# Patient Record
Sex: Female | Born: 1945
Health system: Southern US, Community
[De-identification: ages and names within clinical notes are randomized; demographics above are authoritative.]

## PROBLEM LIST (undated history)

## (undated) DIAGNOSIS — K5792 Diverticulitis of intestine, part unspecified, without perforation or abscess without bleeding: Secondary | ICD-10-CM

## (undated) DIAGNOSIS — N281 Cyst of kidney, acquired: Secondary | ICD-10-CM

## (undated) DIAGNOSIS — C44629 Squamous cell carcinoma of skin of left upper limb, including shoulder: Secondary | ICD-10-CM

## (undated) DIAGNOSIS — F419 Anxiety disorder, unspecified: Secondary | ICD-10-CM

## (undated) DIAGNOSIS — I10 Essential (primary) hypertension: Secondary | ICD-10-CM

## (undated) DIAGNOSIS — M8580 Other specified disorders of bone density and structure, unspecified site: Secondary | ICD-10-CM

## (undated) DIAGNOSIS — A749 Chlamydial infection, unspecified: Secondary | ICD-10-CM

## (undated) DIAGNOSIS — R87619 Unspecified abnormal cytological findings in specimens from cervix uteri: Secondary | ICD-10-CM

## (undated) HISTORY — DX: Other specified disorders of bone density and structure, unspecified site: M85.80

## (undated) HISTORY — DX: Diverticulitis of intestine, part unspecified, without perforation or abscess without bleeding: K57.92

## (undated) HISTORY — DX: Essential (primary) hypertension: I10

## (undated) HISTORY — DX: Chlamydial infection, unspecified: A74.9

## (undated) HISTORY — PX: GYNECOLOGIC CRYOSURGERY: SHX857

## (undated) HISTORY — PX: WISDOM TOOTH EXTRACTION: SHX21

## (undated) HISTORY — DX: Squamous cell carcinoma of skin of left upper limb, including shoulder: C44.629

## (undated) HISTORY — PX: EYE SURGERY: SHX253

## (undated) HISTORY — DX: Anxiety disorder, unspecified: F41.9

## (undated) HISTORY — DX: Unspecified abnormal cytological findings in specimens from cervix uteri: R87.619

## (undated) HISTORY — DX: Cyst of kidney, acquired: N28.1

## (undated) HISTORY — PX: COLONOSCOPY: SHX174

---

## 1999-03-27 ENCOUNTER — Other Ambulatory Visit: Admission: RE | Admit: 1999-03-27 | Discharge: 1999-03-27 | Payer: Self-pay | Admitting: Obstetrics and Gynecology

## 1999-12-08 ENCOUNTER — Encounter: Admission: RE | Admit: 1999-12-08 | Discharge: 1999-12-08 | Payer: Self-pay | Admitting: Obstetrics and Gynecology

## 1999-12-08 ENCOUNTER — Encounter: Payer: Self-pay | Admitting: Obstetrics and Gynecology

## 2000-04-12 ENCOUNTER — Other Ambulatory Visit: Admission: RE | Admit: 2000-04-12 | Discharge: 2000-04-12 | Payer: Self-pay | Admitting: Obstetrics and Gynecology

## 2001-04-26 ENCOUNTER — Encounter: Admission: RE | Admit: 2001-04-26 | Discharge: 2001-04-26 | Payer: Self-pay | Admitting: Obstetrics and Gynecology

## 2001-04-26 ENCOUNTER — Encounter: Payer: Self-pay | Admitting: Obstetrics and Gynecology

## 2001-05-19 ENCOUNTER — Other Ambulatory Visit: Admission: RE | Admit: 2001-05-19 | Discharge: 2001-05-19 | Payer: Self-pay | Admitting: Obstetrics and Gynecology

## 2002-04-21 ENCOUNTER — Ambulatory Visit (HOSPITAL_BASED_OUTPATIENT_CLINIC_OR_DEPARTMENT_OTHER): Admission: RE | Admit: 2002-04-21 | Discharge: 2002-04-21 | Payer: Self-pay | Admitting: Plastic Surgery

## 2002-05-02 ENCOUNTER — Encounter: Admission: RE | Admit: 2002-05-02 | Discharge: 2002-05-02 | Payer: Self-pay | Admitting: Obstetrics and Gynecology

## 2002-05-02 ENCOUNTER — Encounter: Payer: Self-pay | Admitting: Obstetrics and Gynecology

## 2002-06-06 ENCOUNTER — Other Ambulatory Visit: Admission: RE | Admit: 2002-06-06 | Discharge: 2002-06-06 | Payer: Self-pay | Admitting: Obstetrics and Gynecology

## 2002-06-21 ENCOUNTER — Encounter: Payer: Self-pay | Admitting: Obstetrics and Gynecology

## 2002-06-21 ENCOUNTER — Encounter: Admission: RE | Admit: 2002-06-21 | Discharge: 2002-06-21 | Payer: Self-pay | Admitting: Obstetrics and Gynecology

## 2002-12-02 ENCOUNTER — Encounter: Payer: Self-pay | Admitting: Emergency Medicine

## 2002-12-02 ENCOUNTER — Emergency Department (HOSPITAL_COMMUNITY): Admission: EM | Admit: 2002-12-02 | Discharge: 2002-12-02 | Payer: Self-pay | Admitting: Emergency Medicine

## 2003-05-07 ENCOUNTER — Encounter: Payer: Self-pay | Admitting: Obstetrics and Gynecology

## 2003-05-07 ENCOUNTER — Encounter: Admission: RE | Admit: 2003-05-07 | Discharge: 2003-05-07 | Payer: Self-pay | Admitting: Obstetrics and Gynecology

## 2003-06-11 ENCOUNTER — Other Ambulatory Visit: Admission: RE | Admit: 2003-06-11 | Discharge: 2003-06-11 | Payer: Self-pay | Admitting: Obstetrics and Gynecology

## 2004-05-07 ENCOUNTER — Encounter: Admission: RE | Admit: 2004-05-07 | Discharge: 2004-05-07 | Payer: Self-pay | Admitting: Obstetrics and Gynecology

## 2005-01-28 ENCOUNTER — Ambulatory Visit (HOSPITAL_COMMUNITY): Admission: RE | Admit: 2005-01-28 | Discharge: 2005-01-28 | Payer: Self-pay | Admitting: Orthopedic Surgery

## 2005-05-20 ENCOUNTER — Encounter: Admission: RE | Admit: 2005-05-20 | Discharge: 2005-05-20 | Payer: Self-pay | Admitting: Obstetrics and Gynecology

## 2006-07-07 ENCOUNTER — Encounter: Admission: RE | Admit: 2006-07-07 | Discharge: 2006-07-07 | Payer: Self-pay | Admitting: Obstetrics and Gynecology

## 2007-07-19 ENCOUNTER — Encounter: Admission: RE | Admit: 2007-07-19 | Discharge: 2007-07-19 | Payer: Self-pay | Admitting: Obstetrics and Gynecology

## 2008-07-25 ENCOUNTER — Encounter: Admission: RE | Admit: 2008-07-25 | Discharge: 2008-07-25 | Payer: Self-pay | Admitting: Obstetrics and Gynecology

## 2009-09-09 ENCOUNTER — Encounter: Admission: RE | Admit: 2009-09-09 | Discharge: 2009-09-09 | Payer: Self-pay | Admitting: Obstetrics and Gynecology

## 2010-10-16 ENCOUNTER — Encounter
Admission: RE | Admit: 2010-10-16 | Discharge: 2010-10-16 | Payer: Self-pay | Source: Home / Self Care | Attending: Obstetrics and Gynecology | Admitting: Obstetrics and Gynecology

## 2011-02-27 NOTE — Op Note (Signed)
[place]  Patient:    Tammy Leonard, Tammy Leonard Visit Number: [id number] MRN: [id number]          Service Type: DSU Location: [facility] Attending Physician:  Loura Halt Ii Dictated by:   Alfredia Ferguson, M.D. Proc. Date: 04/21/02 Admit Date:  04/21/2002 Discharge Date: 04/21/2002   CC:         Hope M. Danella Deis, M.D.   Operative Report  PREOPERATIVE DIAGNOSIS: 1. Biopsy proven 8 mm squamous cell carcinoma, left hand, dorsal first web    space. 2. A 3 mm sebaceous hyperplasia, midforehead.  POSTOPERATIVE DIAGNOSIS: 1. Biopsy proven 8 mm squamous cell carcinoma, left hand, dorsal first web    space. 2. A 3 mm sebaceous hyperplasia, midforehead.  OPERATION PERFORMED: 1. Elliptical excision of squamous cell carcinoma with approximately 3 mm    margins, first webspace left hand, dorsal side. 2. Cauterization and curetting of sebaceous hyperplasia of midforehead.  SURGEON:  Alfredia Ferguson, M.D.  ANESTHESIA:  2% Xylocaine 1:100,000 epinephrine.  INDICATIONS FOR PROCEDURE:  The patient is a 65 year old woman with a biopsy proven squamous cell carcinoma located on the dorsal first webspace of her left hand.  The patient wishes to have the area widely excised to ensure clear margins.  She also had a prominent sebaceous hyperplasia in the midforehead which I will hyfrecate and curet.  She understands there is a very incidence of recurrence of sebaceous hyperplasia but wishes to try to see if this will help.  She also understands the risk of positive margins on her squamous cell carcinoma and the need for secondary surgery.  She is trading what she has for a permanent and potentially unsightly scar.  In spite of that, the patient wishes to proceed.  DESCRIPTION OF PROCEDURE:  Skin markers were placed in elliptical fashion around the biopsy site of the squamous cell carcinoma left hand dorsal side. Local anesthesia was infiltrated.  The area was  prepped and draped in sterile fashion.  Local anesthesia was also infiltrated at the site of the sebaceous hyperplasia.  The area was prepped with Betadine.  With the Bovie on a very low setting, the surface of the sebaceous hyperplasia was cauterized and then was curetted.  The base was then recauterized.  The patient tolerated the procedure well.  Attention was then directed to the hand.  An elliptical excision of the lesion was carried out down to the level of the subcutaneous space.  The specimen was passed off for pathology.  Hemostasis was accomplished using area.  The wound was closed using 5-0 nylon running suture.  A light dressing was applied.  The patient tolerated the procedure well with almost no bleeding.  She was discharged to home in satisfactory condition. Dictated by:   Alfredia Ferguson, M.D. Attending Physician:  Loura Halt Ii DD:  04/21/02 TD:  04/24/02 Job: [id number]

## 2011-11-20 ENCOUNTER — Other Ambulatory Visit: Payer: Self-pay | Admitting: Obstetrics & Gynecology

## 2011-11-20 DIAGNOSIS — Z1231 Encounter for screening mammogram for malignant neoplasm of breast: Secondary | ICD-10-CM

## 2011-12-09 ENCOUNTER — Ambulatory Visit: Payer: Self-pay

## 2011-12-16 ENCOUNTER — Ambulatory Visit: Payer: Self-pay

## 2011-12-16 ENCOUNTER — Ambulatory Visit
Admission: RE | Admit: 2011-12-16 | Discharge: 2011-12-16 | Disposition: A | Discharge status: Home / Self Care | Payer: BC Managed Care – PPO | Source: Ambulatory Visit | Attending: Obstetrics & Gynecology | Admitting: Obstetrics & Gynecology

## 2011-12-16 DIAGNOSIS — Z1231 Encounter for screening mammogram for malignant neoplasm of breast: Secondary | ICD-10-CM

## 2012-05-03 ENCOUNTER — Other Ambulatory Visit: Payer: Self-pay | Admitting: Internal Medicine

## 2012-05-03 NOTE — Telephone Encounter (Signed)
Needs chart.

## 2012-05-20 ENCOUNTER — Encounter: Payer: Self-pay | Admitting: Internal Medicine

## 2012-05-20 ENCOUNTER — Ambulatory Visit (INDEPENDENT_AMBULATORY_CARE_PROVIDER_SITE_OTHER): Payer: BC Managed Care – PPO | Admitting: Internal Medicine

## 2012-05-20 VITALS — BP 185/77 | HR 63 | Temp 98.7°F | Resp 17 | Ht 61.0 in | Wt 125.0 lb

## 2012-05-20 DIAGNOSIS — F419 Anxiety disorder, unspecified: Secondary | ICD-10-CM

## 2012-05-20 DIAGNOSIS — F411 Generalized anxiety disorder: Secondary | ICD-10-CM

## 2012-05-20 MED ORDER — ALPRAZOLAM 0.5 MG PO TABS: 0.5000 mg | ORAL_TABLET | Freq: Every evening | ORAL | Status: DC | PRN

## 2012-05-20 NOTE — Patient Instructions (Signed)

## 2012-05-20 NOTE — Progress Notes (Signed)
  Subjective:    Patient ID: Tammy Leonard, female    DOB: November 23, 1945, 66 y.o.   MRN: [id number]  HPI Anxiety, does well, uses alprazolam sparingly.   Review of Systems Has all complete check ups with ob/gyn    Objective:   Physical Exam normal       Assessment & Plan:  Alprazolam rf

## 2012-06-29 ENCOUNTER — Ambulatory Visit (INDEPENDENT_AMBULATORY_CARE_PROVIDER_SITE_OTHER): Payer: BC Managed Care – PPO | Admitting: Family Medicine

## 2012-06-29 DIAGNOSIS — Z23 Encounter for immunization: Secondary | ICD-10-CM

## 2013-03-17 ENCOUNTER — Other Ambulatory Visit: Payer: Self-pay

## 2013-03-17 DIAGNOSIS — Z1231 Encounter for screening mammogram for malignant neoplasm of breast: Secondary | ICD-10-CM

## 2013-04-21 ENCOUNTER — Ambulatory Visit
Admission: RE | Admit: 2013-04-21 | Discharge: 2013-04-21 | Disposition: A | Discharge status: Home / Self Care | Payer: BLUE CROSS/BLUE SHIELD | Source: Ambulatory Visit

## 2013-04-21 DIAGNOSIS — Z1231 Encounter for screening mammogram for malignant neoplasm of breast: Secondary | ICD-10-CM

## 2013-06-05 DIAGNOSIS — L570 Actinic keratosis: Secondary | ICD-10-CM | POA: Diagnosis not present

## 2013-06-05 DIAGNOSIS — L719 Rosacea, unspecified: Secondary | ICD-10-CM | POA: Diagnosis not present

## 2013-06-19 ENCOUNTER — Other Ambulatory Visit: Payer: Self-pay | Admitting: Internal Medicine

## 2013-06-19 DIAGNOSIS — F419 Anxiety disorder, unspecified: Secondary | ICD-10-CM

## 2013-06-19 MED ORDER — ALPRAZOLAM 0.5 MG PO TABS: 0.5000 mg | ORAL_TABLET | Freq: Every evening | ORAL | Status: DC | PRN

## 2013-08-01 ENCOUNTER — Ambulatory Visit: Payer: Medicare Other | Admitting: Sports Medicine

## 2013-08-22 ENCOUNTER — Ambulatory Visit: Payer: Medicare Other | Admitting: Sports Medicine

## 2013-08-22 ENCOUNTER — Ambulatory Visit (INDEPENDENT_AMBULATORY_CARE_PROVIDER_SITE_OTHER): Payer: Medicare Other | Admitting: Sports Medicine

## 2013-08-22 ENCOUNTER — Encounter: Payer: Self-pay | Admitting: Sports Medicine

## 2013-08-22 VITALS — BP 188/89 | HR 75 | Ht 61.0 in | Wt 120.0 lb

## 2013-08-22 DIAGNOSIS — IMO0002 Reserved for concepts with insufficient information to code with codable children: Secondary | ICD-10-CM

## 2013-08-22 DIAGNOSIS — R030 Elevated blood-pressure reading, without diagnosis of hypertension: Secondary | ICD-10-CM | POA: Diagnosis not present

## 2013-08-22 DIAGNOSIS — M79609 Pain in unspecified limb: Secondary | ICD-10-CM | POA: Diagnosis not present

## 2013-08-22 DIAGNOSIS — S76311A Strain of muscle, fascia and tendon of the posterior muscle group at thigh level, right thigh, initial encounter: Secondary | ICD-10-CM

## 2013-08-22 NOTE — Progress Notes (Signed)
  Subjective:    Patient ID: Tammy Leonard, female    DOB: 02/06/1946, 67 y.o.   MRN: [id number]  HPI Patient is a 67 yo female new patient who presents for evaluation of right hamstring pain. States about 8 weeks ago she was playing tennis. She was at the net when she had to turn and sprint to the baseline. While sprinting she noted a sudden pain and pop in the middle of her hamstring. She discontinued playing at that time. She denies swelling or bruising in the area. She held off on exercise over the next week and used ice and epsom salt baths on this area. She notes over the next 2-3 weeks she eased herself back in to playing tennis with no long strides as she had pain in her hamstring with these. She also notes using KT tape and found this helpful. She has been taking aleve. Notes has been feeling improved and feels as though she is back to about 85% normal.    Review of Systems see HPI     Objective:   Physical Exam Well nourished, well developed  Right LE: no apparent swelling or erythema over hamstring, she has full range of motion at the knee, she has mild tenderness to palpation over the mid portion of her hamstring, she is non-tender at the origin and insertion sites of the hamstring, 4/5 strength on testing all 3 heads of the hamstring, 4/5 strength with isolation of the biceps femoris, she is neurovascularly intact, H test with ROM past 90 degrees  Left LE: no apparent swelling or erythema over hamstring, she has full range of motion at the knee, she has no tenderness to palpation over her hamstring, she is non-tender at the origin and insertion sites of the hamstring, 5/5 strength on testing all 3 heads of the hamstring, 5/5 strength with isolation of the biceps femoris, she is neurovascularly intact, H test with ROM past 90 degrees  On running her right foot is externally rotated. Post-shoe lift placement this external rotation is improved.  Ultrasound of right hamstring performed  in the longitudinal and transverse views revealing hyperechoic area underlying location of pain indicating fibrosis in the area of injury, no evidence of effusion.      Assessment & Plan:  Grade II hamstring strain Elevated blood pressure  Please see individual problems for plan.

## 2013-08-22 NOTE — Assessment & Plan Note (Signed)
Patient with elevation in blood pressure today in clinic. Notes no prior history of hypertension. Notes has previously checked it at home several months ago and was typically in the 140's/80's. Discussed with her that she should check her blood pressure using her husbands blood pressure cuff at home for the next few days and then call back to the [facility]. If continues to be elevated she will need to be started on medication and should be followed by her PCP.

## 2013-08-22 NOTE — Patient Instructions (Addendum)
Nice to meet you. Please wear the sleeve over your hamstring for the next 3-6 months. Please do the exercises listed below. Do these 5-8 reps, 2-3 sets at a time. Extender: laying on your back, flex at your hip with your knee bent, then extend your lower leg to straighten out your leg. Do this slowly to stretch your hamstring. Diver stretch: stand on one leg, bend from the waist to touch towards the ground. With ankle weight, hamstring curls: stand and curl your heal up in the air to flex your hamstring. Leg swing: moving in the posterior direction curls your hamstring like above, though also move from your hip as well. Modified lunge: lunges that are like volley motion in tennis

## 2013-08-22 NOTE — Assessment & Plan Note (Addendum)
Patient with grade II strain of right hamstring that has been improving. US revealed area of fibrosis in the area of her pain indicating that her lesion is healing. Discussed that she has likely suffered a tear in the region of her injury and that given her age she is at higher likelihood for a future tear of her hamstring. Discussed and demonstrated exercises and stretches for the patient to do to help in the recovery from this injury. Will provide with heal lifts to limit the stretch of her hamstrings while continuing to play tennis. She was also provided with a sleeve for her hamstring. She is to where this during physical activity. Discussed that she is to follow-up as needed if her pain does not continue to improve.

## 2013-09-05 ENCOUNTER — Encounter: Payer: Self-pay | Admitting: Family Medicine

## 2013-09-05 ENCOUNTER — Ambulatory Visit (INDEPENDENT_AMBULATORY_CARE_PROVIDER_SITE_OTHER): Payer: Medicare Other | Admitting: Family Medicine

## 2013-09-05 VITALS — BP 170/80 | HR 70 | Temp 98.6°F | Resp 16 | Ht 61.0 in | Wt 126.0 lb

## 2013-09-05 DIAGNOSIS — I10 Essential (primary) hypertension: Secondary | ICD-10-CM | POA: Diagnosis not present

## 2013-09-05 DIAGNOSIS — Z23 Encounter for immunization: Secondary | ICD-10-CM | POA: Diagnosis not present

## 2013-09-05 DIAGNOSIS — Z8719 Personal history of other diseases of the digestive system: Secondary | ICD-10-CM | POA: Diagnosis not present

## 2013-09-05 DIAGNOSIS — Z0000 Encounter for general adult medical examination without abnormal findings: Secondary | ICD-10-CM

## 2013-09-05 DIAGNOSIS — Z8659 Personal history of other mental and behavioral disorders: Secondary | ICD-10-CM

## 2013-09-05 LAB — COMPREHENSIVE METABOLIC PANEL
AST: 31 U/L (ref 0–37)
Alkaline Phosphatase: 49 U/L (ref 39–117)
BUN: 23 mg/dL (ref 6–23)
CO2: 25 mEq/L (ref 19–32)
Chloride: 103 mEq/L (ref 96–112)
Creat: 0.9 mg/dL (ref 0.50–1.10)
Glucose, Bld: 106 mg/dL — ABNORMAL HIGH (ref 70–99)
Potassium: 4.4 mEq/L (ref 3.5–5.3)
Sodium: 139 mEq/L (ref 135–145)
Total Bilirubin: 0.5 mg/dL (ref 0.3–1.2)
Total Protein: 7.2 g/dL (ref 6.0–8.3)

## 2013-09-05 LAB — POCT CBC
Granulocyte percent: 67.3 %G (ref 37–80)
Hemoglobin: 14.4 g/dL (ref 12.2–16.2)
Lymph, poc: 1.5 (ref 0.6–3.4)
MCHC: 31.2 g/dL — AB (ref 31.8–35.4)
MPV: 7.8 fL (ref 0–99.8)
Platelet Count, POC: 253 10*3/uL (ref 142–424)
WBC: 5.6 10*3/uL (ref 4.6–10.2)

## 2013-09-05 MED ORDER — LISINOPRIL 10 MG PO TABS: 10.0000 mg | ORAL_TABLET | Freq: Every day | ORAL | Status: DC

## 2013-09-05 NOTE — Patient Instructions (Signed)
Great to see you today- happy thanksgiving!   Start on the lisinopril for your BP once a day- please send me a few BP readings in a couple of weeks.  I will be in touch with the rest of your labs; you can see these online on Mychart if you would like.

## 2013-09-05 NOTE — Progress Notes (Signed)
[facility] [address] [place] [address] [phone]  Date:  09/05/2013   Name:  Tammy Leonard   DOB:  11/18/1945   MRN:  [id number]  PCP:  Tally Due, MD    Chief Complaint: Annual Exam   History of Present Illness:  Tammy Leonard is a 67 y.o. very pleasant female patient who presents with the following:  Here as a new pt today for a CPE- no pap Non- smoker, does drink about 10 drinks per week.   She is married, does exercise.  She likes to play tennis and golf, and practices and pilates.   She is also a pt at [facility] [employer].  She is seen there regularly.   Just about a year ago she started checking her BP.  She has had it checked at Dr. Gordy Savers office and her BP was 188/89.   She has been checking her BP at home, and has found that her systolic pressures tend to run in the 140s- 160s.  She is starting to get worried about this and does not know if she should start on some medication She does not have any sx such as HA or CP . When she exercises she does not have CP  She is fasting today.   She takes xanax as needed for anxiety; she does not take it very often.she notes that a bottle will often last a whole year.   She does have a history of diverticulities.  Dr. Kinnie Scales is her GI doctor.  He follows her regularly and her colonoscopy is UTD  She had a bone density test a few years ago that looked ok.  She will see her GYN in the spring, will discuss her Dexa then   She thinks that her tetanus shot is UTD but she will send [place] a copy of the records that she has at home.   Patient Active Problem List   Diagnosis Date Noted  . Right hamstring muscle strain 08/22/2013  . Elevated blood pressure 08/22/2013    Past Medical History  Diagnosis Date  . Anxiety   . Diverticulitis     No past surgical history on file.  History  Substance Use Topics  . Smoking status: Never Smoker   . Smokeless tobacco: Never Used  . Alcohol Use: Not on  file    Family History  Problem Relation Age of Onset  . Heart disease Mother   . Heart disease Father     Allergies  Allergen Reactions  . Compazine [Prochlorperazine Edisylate] Anxiety    Medication list has been reviewed and updated.  Current Outpatient Prescriptions on File Prior to Visit  Medication Sig Dispense Refill  . ALPRAZolam (XANAX) 0.5 MG tablet Take 1 tablet (0.5 mg total) by mouth at bedtime as needed.  60 tablet  3  . ampicillin (PRINCIPEN) 500 MG capsule Take 500 mg by mouth 1 day or 1 dose.        No current facility-administered medications on file prior to visit.    Review of Systems:  As per HPI- otherwise negative.   Physical Examination: Filed Vitals:   09/05/13 0812  BP: 176/82  Pulse: 70  Temp: 98.6 F (37 C)  Resp: 16   Filed Vitals:   09/05/13 0812  Height: 5\' 1"  (1.549 m)  Weight: 126 lb (57.153 kg)   Body mass index is 23.82 kg/(m^2). Ideal Body Weight: Weight in (lb) to have BMI = 25: 132  GEN: WDWN, NAD, Non-toxic, A & O x 3, looks well HEENT: Atraumatic, Normocephalic. Neck supple. No masses, No LAD.  Bilateral TM wnl, oropharynx normal.  PEERL,EOMI.   Ears and Nose: No external deformity. CV: RRR, No M/G/R. No JVD. No thrill. No extra heart sounds. PULM: CTA B, no wheezes, crackles, rhonchi. No retractions. No resp. distress. No accessory muscle use. ABD: S, NT, ND, +BS. No rebound. No HSM. EXTR: No c/c/e NEURO Normal gait.  PSYCH: Normally interactive. Conversant. Not depressed or anxious appearing.  Calm demeanor.   EKG:  Appears normal to my read.  NSR without ST elevation or depression.  Machine read is abnormal  Assessment and Plan: Physical exam - Plan: Pneumococcal polysaccharide vaccine 23-valent greater than or equal to 2yo subcutaneous/IM  High blood pressure - Plan: TSH, Lipid panel, EKG 12-Lead, lisinopril (PRINIVIL,ZESTRIL) 10 MG tablet  History of diverticulitis of colon - Plan: POCT CBC, Comprehensive  metabolic panel  History of anxiety  CPE today- caught up on her pneumovax.  She will send her records regarding her other immunizations Start lisinopril 10mg  for HTN Will plan further follow- up pending labs. Discussed abnormal EKG read.  She exercises regularly and does not have any history of CP.  Discussed referral for stress test but at this time she would like to defer.   See patient instructions for more details.     Results for orders placed in visit on 09/05/13  COMPREHENSIVE METABOLIC PANEL      Result Value Range   Sodium 139  135 - 145 mEq/L   Potassium 4.4  3.5 - 5.3 mEq/L   Chloride 103  96 - 112 mEq/L   CO2 25  19 - 32 mEq/L   Glucose, Bld 106 (*) 70 - 99 mg/dL   BUN 23  6 - 23 mg/dL   Creat 1.47  8.29 - 5.62 mg/dL   Total Bilirubin 0.5  0.3 - 1.2 mg/dL   Alkaline Phosphatase 49  39 - 117 U/L   AST 31  0 - 37 U/L   ALT 23  0 - 35 U/L   Total Protein 7.2  6.0 - 8.3 g/dL   Albumin 4.8  3.5 - 5.2 g/dL   Calcium 13.0  8.4 - 86.5 mg/dL  TSH      Result Value Range   TSH 0.766  0.350 - 4.500 uIU/mL  LIPID PANEL      Result Value Range   Cholesterol 220 (*) 0 - 200 mg/dL   Triglycerides 38  <784 mg/dL   HDL 696  >29 mg/dL   Total CHOL/HDL Ratio 1.8     VLDL 8  0 - 40 mg/dL   LDL Cholesterol 91  0 - 99 mg/dL  POCT CBC      Result Value Range   WBC 5.6  4.6 - 10.2 K/uL   Lymph, poc 1.5  0.6 - 3.4   POC LYMPH PERCENT 27.1  10 - 50 %L   MID (cbc) 0.3  0 - 0.9   POC MID % 5.6  0 - 12 %M   POC Granulocyte 3.8  2 - 6.9   Granulocyte percent 67.3  37 - 80 %G   RBC 4.70  4.04 - 5.48 M/uL   Hemoglobin 14.4  12.2 - 16.2 g/dL   HCT, POC 52.8  41.3 - 47.9 %   MCV 98.2 (*) 80 - 97 fL   MCH, POC 30.6  27 - 31.2 pg   MCHC 31.2 (*)  31.8 - 35.4 g/dL   RDW, POC 16.1     Platelet Count, POC 253  142 - 424 K/uL   MPV 7.8  0 - 99.8 fL   Declines flu shot today Signed Abbe Amsterdam, MD

## 2013-09-11 DIAGNOSIS — L821 Other seborrheic keratosis: Secondary | ICD-10-CM | POA: Diagnosis not present

## 2013-09-11 DIAGNOSIS — D485 Neoplasm of uncertain behavior of skin: Secondary | ICD-10-CM | POA: Diagnosis not present

## 2013-09-11 DIAGNOSIS — Z85828 Personal history of other malignant neoplasm of skin: Secondary | ICD-10-CM | POA: Diagnosis not present

## 2013-09-11 DIAGNOSIS — L719 Rosacea, unspecified: Secondary | ICD-10-CM | POA: Diagnosis not present

## 2013-09-11 DIAGNOSIS — D239 Other benign neoplasm of skin, unspecified: Secondary | ICD-10-CM | POA: Diagnosis not present

## 2013-09-11 DIAGNOSIS — D2330 Other benign neoplasm of skin of unspecified part of face: Secondary | ICD-10-CM | POA: Diagnosis not present

## 2013-09-11 DIAGNOSIS — L570 Actinic keratosis: Secondary | ICD-10-CM | POA: Diagnosis not present

## 2013-09-21 ENCOUNTER — Encounter: Payer: Self-pay | Admitting: Family Medicine

## 2013-10-13 ENCOUNTER — Encounter: Payer: Self-pay | Admitting: Family Medicine

## 2013-10-13 DIAGNOSIS — G4700 Insomnia, unspecified: Secondary | ICD-10-CM

## 2013-10-15 MED ORDER — ZOLPIDEM TARTRATE 10 MG PO TABS: 5.0000 mg | ORAL_TABLET | Freq: Every evening | ORAL | Status: DC | PRN

## 2013-11-30 ENCOUNTER — Encounter: Payer: Self-pay | Admitting: Obstetrics & Gynecology

## 2013-11-30 ENCOUNTER — Ambulatory Visit (INDEPENDENT_AMBULATORY_CARE_PROVIDER_SITE_OTHER): Payer: Medicare Other | Admitting: Obstetrics & Gynecology

## 2013-11-30 VITALS — BP 154/68 | HR 64 | Resp 16 | Ht 61.0 in | Wt 128.2 lb

## 2013-11-30 DIAGNOSIS — Z124 Encounter for screening for malignant neoplasm of cervix: Secondary | ICD-10-CM | POA: Diagnosis not present

## 2013-11-30 DIAGNOSIS — E2839 Other primary ovarian failure: Secondary | ICD-10-CM

## 2013-11-30 DIAGNOSIS — Z01419 Encounter for gynecological examination (general) (routine) without abnormal findings: Secondary | ICD-10-CM

## 2013-11-30 NOTE — Progress Notes (Signed)
68 y.o. G2P2 MarriedCaucasianF here for annual exam.  No vaginal bleeding.  Doing well.  On Lisinopril.  Having a little cough so she is doing a "trial" to see if the cough is medication related.    No LMP recorded. Patient is postmenopausal.          Sexually active: no  The current method of family planning is vasectomy.    Exercising: yes  tennis, golf, yoga, and pilates Smoker:  no  Health Maintenance: Pap:  08/29/12 WNL/negative HR HPV History of abnormal Pap:  yes MMG:  04/21/13 3D normal Colonoscopy:  2011 repeat in 5-7 years BMD:   2008/09 normal TDaP:  2006 Screening Labs: PCP, Hb today: PCP, Urine today: PCP   reports that she has never smoked. She has never used smokeless tobacco. She reports that she drinks about 3.5 ounces of alcohol per week. She reports that she does not use illicit drugs.  Past Medical History  Diagnosis Date  . Anxiety   . Diverticulitis   . Renal cyst     stable CT 5/11 [place] 10/12  . Chlamydia     h/o  . Abnormal Pap smear of cervix     Past Surgical History  Procedure Laterality Date  . Gynecologic cryosurgery      Current Outpatient Prescriptions  Medication Sig Dispense Refill  . ALPRAZolam (XANAX) 0.5 MG tablet Take 1 tablet (0.5 mg total) by mouth at bedtime as needed.  60 tablet  3  . ampicillin (PRINCIPEN) 500 MG capsule Take 500 mg by mouth daily. 5 x week      . aspirin 81 MG tablet Take 81 mg by mouth daily.      . B Complex Vitamins (VITAMIN B COMPLEX PO) Take by mouth daily.      Marland Kitchen CALCIUM PO Take by mouth. + magnesium and vitamin d daily      . Glucosamine HCl (GLUCOSAMINE PO) Take by mouth daily.      Marland Kitchen lisinopril (PRINIVIL,ZESTRIL) 10 MG tablet Take 1 tablet (10 mg total) by mouth daily.  30 tablet  3  . Multiple Vitamins-Minerals (MULTIVITAMIN PO) Take by mouth daily.       No current facility-administered medications for this visit.    Family History  Problem Relation Age of Onset  . Heart disease Mother   . Heart  disease Father   . Breast cancer Mother 58    and stomach cancer  . Prostate cancer Father   . Mitral valve prolapse Mother   . Depression Daughter     ROS:  Pertinent items are noted in HPI.  Otherwise, a comprehensive ROS was negative.  Exam:   BP 154/68  Pulse 64  Resp 16  Ht 5\' 1"  (1.549 m)  Wt 128 lb 3.2 oz (58.151 kg)  BMI 24.24 kg/m2  Weight change: -2lb   Height: 5\' 1"  (154.9 cm)  Ht Readings from Last 3 Encounters:  11/30/13 5\' 1"  (1.549 m)  09/05/13 5\' 1"  (1.549 m)  08/22/13 5\' 1"  (1.549 m)    General appearance: alert, cooperative and appears stated age Head: Normocephalic, without obvious abnormality, atraumatic Neck: no adenopathy, supple, symmetrical, trachea midline and thyroid normal to inspection and palpation Lungs: clear to auscultation bilaterally Breasts: normal appearance, no masses or tenderness Heart: regular rate and rhythm Abdomen: soft, non-tender; bowel sounds normal; no masses,  no organomegaly Extremities: extremities normal, atraumatic, no cyanosis or edema Skin: Skin color, texture, turgor normal. No rashes or lesions Lymph nodes:  Cervical, supraclavicular, and axillary nodes normal. No abnormal inguinal nodes palpated Neurologic: Grossly normal   Pelvic: External genitalia:  no lesions              Urethra:  normal appearing urethra with no masses, tenderness or lesions              Bartholins and Skenes: normal                 Vagina: normal appearing vagina with normal color and discharge, no lesions              Cervix: no lesions              Pap taken: no Bimanual Exam:  Uterus:  normal size, contour, position, consistency, mobility, non-tender              Adnexa: normal adnexa and no mass, fullness, tenderness               Rectovaginal: Confirms               Anus:  normal sphincter tone, no lesions  A:  Well Woman with normal exam H/o sever dysplasia, 30 years H/o DDD in lumbar spine  P:   Mammogram yearly.   pap smear not  indicated. Order for BMD given. return annually or prn  An After Visit Summary was printed and given to the patient.

## 2014-01-16 ENCOUNTER — Other Ambulatory Visit: Payer: Self-pay | Admitting: Family Medicine

## 2014-02-18 ENCOUNTER — Other Ambulatory Visit: Payer: Self-pay | Admitting: Family Medicine

## 2014-03-19 ENCOUNTER — Other Ambulatory Visit: Payer: Self-pay | Admitting: Family Medicine

## 2014-04-23 ENCOUNTER — Ambulatory Visit (INDEPENDENT_AMBULATORY_CARE_PROVIDER_SITE_OTHER): Payer: Medicare Other | Admitting: Family Medicine

## 2014-04-23 ENCOUNTER — Encounter: Payer: Self-pay | Admitting: Family Medicine

## 2014-04-23 VITALS — BP 140/62 | HR 64 | Temp 98.6°F | Resp 16 | Ht 60.75 in | Wt 125.0 lb

## 2014-04-23 DIAGNOSIS — I10 Essential (primary) hypertension: Secondary | ICD-10-CM

## 2014-04-23 LAB — BASIC METABOLIC PANEL
BUN: 21 mg/dL (ref 6–23)
CALCIUM: 9.9 mg/dL (ref 8.4–10.5)
CO2: 25 mEq/L (ref 19–32)
Chloride: 104 mEq/L (ref 96–112)
Creat: 0.76 mg/dL (ref 0.50–1.10)
GLUCOSE: 131 mg/dL — AB (ref 70–99)
Potassium: 4.2 mEq/L (ref 3.5–5.3)
SODIUM: 138 meq/L (ref 135–145)

## 2014-04-23 MED ORDER — LISINOPRIL 10 MG PO TABS: 10.0000 mg | ORAL_TABLET | Freq: Every day | ORAL | Status: DC

## 2014-04-23 NOTE — Progress Notes (Signed)
[facility] [address], [place] [address] [phone]  Date:  04/23/2014   Name:  Tammy Leonard   DOB:  07/16/1946   MRN:  [id number]  PCP:  Lamar Blinks, MD    Chief Complaint: Medication Refill   History of Present Illness:  Tammy Leonard is a 68 y.o. very pleasant female patient who presents with the following:  She is here today for a medication refill.  We started lisinopril for her BP at her last physical in November 2014. She is checking her BP at home- she may run 130-144/ 70s.  She feels that she is tolerating the medication ok.  She did have a little cough, but this resolved.  Overall she is doing very well  She had a tdap 2006 prior to a trip to [place]  Patient Active Problem List   Diagnosis Date Noted  . Right hamstring muscle strain 08/22/2013  . Elevated blood pressure 08/22/2013    Past Medical History  Diagnosis Date  . Anxiety   . Diverticulitis   . Renal cyst     stable CT 5/11 [place] 10/12  . Chlamydia     h/o  . Abnormal Pap smear of cervix   . Hypertension     Past Surgical History  Procedure Laterality Date  . Gynecologic cryosurgery      History  Substance Use Topics  . Smoking status: Never Smoker   . Smokeless tobacco: Never Used  . Alcohol Use: 3.5 oz/week    7 drink(s) per week     Comment: glass of wine daily    Family History  Problem Relation Age of Onset  . Heart disease Mother   . Heart disease Father   . Breast cancer Mother 39    and stomach cancer  . Prostate cancer Father   . Mitral valve prolapse Mother   . Depression Daughter     Allergies  Allergen Reactions  . Compazine [Prochlorperazine Edisylate] Anxiety    Medication list has been reviewed and updated.  Current Outpatient Prescriptions on File Prior to Visit  Medication Sig Dispense Refill  . ALPRAZolam (XANAX) 0.5 MG tablet Take 1 tablet (0.5 mg total) by mouth at bedtime as needed.  60 tablet  3  . ampicillin (PRINCIPEN)  500 MG capsule Take 500 mg by mouth daily. 5 x week      . aspirin 81 MG tablet Take 81 mg by mouth daily.      . B Complex Vitamins (VITAMIN B COMPLEX PO) Take by mouth daily.      Marland Kitchen CALCIUM PO Take by mouth. + magnesium and vitamin d daily      . Glucosamine HCl (GLUCOSAMINE PO) Take by mouth daily.      Marland Kitchen lisinopril (PRINIVIL,ZESTRIL) 10 MG tablet Take 1 tablet (10 mg total) by mouth daily. PATIENT NEEDS OFFICE VISIT FOR ADDITIONAL REFILLS - 2nd NOTICE  15 tablet  0  . Multiple Vitamins-Minerals (MULTIVITAMIN PO) Take by mouth daily.       No current facility-administered medications on file prior to visit.    Review of Systems:  As per HPI- otherwise negative.   Physical Examination: Filed Vitals:   04/23/14 1107  BP: 140/62  Pulse: 64  Temp: 98.6 F (37 C)  Resp: 16   Filed Vitals:   04/23/14 1107  Height: 5' 0.75" (1.543 m)  Weight: 125 lb (56.7 kg)   Body mass index is 23.82 kg/(m^2). Ideal Body Weight: Weight  in (lb) to have BMI = 25: 131  GEN: WDWN, NAD, Non-toxic, A & O x 3, looks well, fit build and active appearance HEENT: Atraumatic, Normocephalic. Neck supple. No masses, No LAD. Ears and Nose: No external deformity. CV: RRR, No M/G/R. No JVD. No thrill. No extra heart sounds. PULM: CTA B, no wheezes, crackles, rhonchi. No retractions. No resp. distress. No accessory muscle use. ABD: S, NT, ND. No rebound. No HSM. EXTR: No c/c/e NEURO Normal gait.  PSYCH: Normally interactive. Conversant. Not depressed or anxious appearing.  Calm demeanor.    Assessment and Plan: Essential hypertension - Plan: lisinopril (PRINIVIL,ZESTRIL) 10 MG tablet, Basic metabolic panel  Refilled her lisinopril today- it seems to be working well for her.  Check BMP.    She will come and see me for a CPE this fall/ winter/   Signed Lamar Blinks, MD

## 2014-04-23 NOTE — Patient Instructions (Signed)
Good to see you again today!  I will be in touch with your labs.  Your BP looks much better.  Please come and see me for a physical sometime this fall or winter.

## 2014-05-29 ENCOUNTER — Other Ambulatory Visit: Payer: Self-pay

## 2014-05-29 DIAGNOSIS — Z1231 Encounter for screening mammogram for malignant neoplasm of breast: Secondary | ICD-10-CM

## 2014-06-14 ENCOUNTER — Ambulatory Visit
Admission: RE | Admit: 2014-06-14 | Discharge: 2014-06-14 | Disposition: A | Discharge status: Home / Self Care | Payer: Medicare Other | Source: Ambulatory Visit

## 2014-06-14 ENCOUNTER — Ambulatory Visit
Admission: RE | Admit: 2014-06-14 | Discharge: 2014-06-14 | Disposition: A | Discharge status: Home / Self Care | Payer: Medicare Other | Source: Ambulatory Visit | Attending: Obstetrics & Gynecology | Admitting: Obstetrics & Gynecology

## 2014-06-14 DIAGNOSIS — E2839 Other primary ovarian failure: Secondary | ICD-10-CM

## 2014-06-14 DIAGNOSIS — Z1231 Encounter for screening mammogram for malignant neoplasm of breast: Secondary | ICD-10-CM | POA: Diagnosis not present

## 2014-06-14 DIAGNOSIS — Z780 Asymptomatic menopausal state: Secondary | ICD-10-CM | POA: Diagnosis not present

## 2014-08-04 ENCOUNTER — Ambulatory Visit (INDEPENDENT_AMBULATORY_CARE_PROVIDER_SITE_OTHER): Payer: Medicare Other | Admitting: Emergency Medicine

## 2014-08-04 ENCOUNTER — Ambulatory Visit (INDEPENDENT_AMBULATORY_CARE_PROVIDER_SITE_OTHER): Payer: Medicare Other

## 2014-08-04 ENCOUNTER — Other Ambulatory Visit: Payer: Self-pay | Admitting: Emergency Medicine

## 2014-08-04 VITALS — BP 170/70 | HR 69 | Temp 98.1°F | Resp 16 | Ht 61.0 in | Wt 127.4 lb

## 2014-08-04 DIAGNOSIS — J209 Acute bronchitis, unspecified: Secondary | ICD-10-CM

## 2014-08-04 DIAGNOSIS — R0989 Other specified symptoms and signs involving the circulatory and respiratory systems: Secondary | ICD-10-CM

## 2014-08-04 DIAGNOSIS — I10 Essential (primary) hypertension: Secondary | ICD-10-CM

## 2014-08-04 LAB — POCT CBC
Granulocyte percent: 63.8 %G (ref 37–80)
HCT, POC: 44.1 % (ref 37.7–47.9)
HEMOGLOBIN: 13.7 g/dL (ref 12.2–16.2)
Lymph, poc: 1.9 (ref 0.6–3.4)
MCH, POC: 29.6 pg (ref 27–31.2)
MCHC: 31 g/dL — AB (ref 31.8–35.4)
MCV: 95.4 fL (ref 80–97)
MID (CBC): 0.5 (ref 0–0.9)
MPV: 6.4 fL (ref 0–99.8)
PLATELET COUNT, POC: 315 10*3/uL (ref 142–424)
POC Granulocyte: 4.2 (ref 2–6.9)
POC LYMPH PERCENT: 28.1 %L (ref 10–50)
POC MID %: 8.1 %M (ref 0–12)
RBC: 4.62 M/uL (ref 4.04–5.48)
RDW, POC: 16 %
WBC: 6.6 10*3/uL (ref 4.6–10.2)

## 2014-08-04 LAB — POCT INFLUENZA A/B
INFLUENZA A, POC: NEGATIVE
Influenza B, POC: NEGATIVE

## 2014-08-04 MED ORDER — BENZONATATE 100 MG PO CAPS: 100.0000 mg | ORAL_CAPSULE | Freq: Three times a day (TID) | ORAL | Status: DC | PRN

## 2014-08-04 MED ORDER — AMLODIPINE BESYLATE 2.5 MG PO TABS: 2.5000 mg | ORAL_TABLET | Freq: Every day | ORAL | Status: DC

## 2014-08-04 MED ORDER — ONDANSETRON 8 MG PO TBDP: ORAL_TABLET | ORAL | Status: DC

## 2014-08-04 MED ORDER — IPRATROPIUM BROMIDE 0.02 % IN SOLN
0.5000 mg | Freq: Once | RESPIRATORY_TRACT | Status: AC
  Administered 2014-08-04: 0.5 mg via RESPIRATORY_TRACT

## 2014-08-04 MED ORDER — ALBUTEROL SULFATE (2.5 MG/3ML) 0.083% IN NEBU
2.5000 mg | INHALATION_SOLUTION | Freq: Once | RESPIRATORY_TRACT | Status: AC
  Administered 2014-08-04: 2.5 mg via RESPIRATORY_TRACT

## 2014-08-04 MED ORDER — ALBUTEROL SULFATE HFA 108 (90 BASE) MCG/ACT IN AERS: 2.0000 | INHALATION_SPRAY | Freq: Four times a day (QID) | RESPIRATORY_TRACT | Status: DC | PRN

## 2014-08-04 MED ORDER — AZITHROMYCIN 250 MG PO TABS: ORAL_TABLET | ORAL | Status: DC

## 2014-08-04 NOTE — Patient Instructions (Signed)

## 2014-08-04 NOTE — Progress Notes (Addendum)
Subjective:  This chart was scribed for Tammy Champagne, MD by Mercy Moore, Medial Scribe. This patient was seen in room 8 and the patient's care was started at 9:24 AM.    Patient ID: Tammy Leonard, female    DOB: 1946-03-31, 68 y.o.   MRN: [id number]  Chief Complaint  Patient presents with  . Fever    x1 week  . Cough    x1 week  . chest congestion    pt states she not producing any phelgm; chest feels tight  . Neck Pain    x1 week off and on; pt states the back of her neck aches some    HPI HPI Comments: Tammy Leonard is a 68 y.o. female who presents to the Emergency Department complaining of flu like symptoms for over one week. Patient reports that her initial symptoms included myalgias, low grade fever measured at 100F and congestion. Patient reports resolution of her fever and myalgias four days ago, but her congestion and nonproductive cough have remained without improvement. Patient describes chest tightness with coughing and states that she has been unable to produce sputum. Patient reports treatment with Advil and Mucinex DM, without relief of her cough. Patient reports similar symptoms following a trip to [place] in January of this year. After recently returning from a second trip to [place] patient reports development of her current symptoms. Patient's husband, who accompanied her on the trip, has similar symptoms, but she states that his condition is improving unlike hers.  Patient denies history of asthma, tobacco use, or any respiratory issues.  Patient Active Problem List   Diagnosis Date Noted  . Right hamstring muscle strain 08/22/2013  . Elevated blood pressure 08/22/2013   Past Medical History  Diagnosis Date  . Anxiety   . Diverticulitis   . Renal cyst     stable CT 5/11 [place] 10/12  . Chlamydia     h/o  . Abnormal Pap smear of cervix   . Hypertension    Past Surgical History  Procedure Laterality Date  . Gynecologic cryosurgery     Allergies    Allergen Reactions  . Compazine [Prochlorperazine Edisylate] Anxiety   Prior to Admission medications   Medication Sig Start Date End Date Taking? Authorizing Provider  ALPRAZolam Duanne Moron) 0.5 MG tablet Take 1 tablet (0.5 mg total) by mouth at bedtime as needed. 06/19/13  Yes Orma Flaming, MD  ampicillin (PRINCIPEN) 500 MG capsule Take 500 mg by mouth daily. 5 x week   Yes Historical Provider, MD  aspirin 81 MG tablet Take 81 mg by mouth daily.   Yes Historical Provider, MD  B Complex Vitamins (VITAMIN B COMPLEX PO) Take by mouth daily.   Yes Historical Provider, MD  CALCIUM PO Take by mouth. + magnesium and vitamin d daily   Yes Historical Provider, MD  Glucosamine HCl (GLUCOSAMINE PO) Take by mouth daily.   Yes Historical Provider, MD  lisinopril (PRINIVIL,ZESTRIL) 10 MG tablet Take 1 tablet (10 mg total) by mouth daily. 04/23/14  Yes Gay Filler Copland, MD  Multiple Vitamins-Minerals (MULTIVITAMIN PO) Take by mouth daily.   Yes Historical Provider, MD   History   Social History  . Marital Status: Married    Spouse Name: N/A    Number of Children: N/A  . Years of Education: N/A   Occupational History  . Not on file.   Social History Main Topics  . Smoking status: Never Smoker   . Smokeless tobacco: Never  Used  . Alcohol Use: 3.5 oz/week    7 drink(s) per week     Comment: glass of wine daily  . Drug Use: No  . Sexual Activity: No     Comment: vasectomy   Other Topics Concern  . Not on file   Social History Narrative  . No narrative on file    Review of Systems  Constitutional: Positive for fever.  HENT: Positive for congestion.   Respiratory: Positive for cough.   Cardiovascular: Positive for chest pain.  Musculoskeletal: Positive for myalgias.       Objective:   Physical Exam  Nursing note and vitals reviewed.   CONSTITUTIONAL: Well developed/well nourished HEAD: Normocephalic/atraumatic EYES: EOMI/PERRL ENMT: Mucous membranes moist NECK: supple no  meningeal signs SPINE:entire spine nontender CV: S1/S2 noted, no murmurs/rubs/gallops noted LUNGS: no apparent distress; prolonged expiratory phase bilaterally; rhonchi in both lungs, worse on the left; no dullness ABDOMEN: soft, nontender, no rebound or guarding GU:no cva tenderness NEURO: Pt is awake/alert, moves all extremitiesx4 EXTREMITIES: pulses normal, full ROM SKIN: warm, color normal PSYCH: no abnormalities of mood noted  Filed Vitals:   08/04/14 0906  BP: 170/60  Pulse: 69  Temp: 98.1 F (36.7 C)  TempSrc: Oral  Resp: 16  Height: 5\' 1"  (1.549 m)  Weight: 127 lb 6.4 oz (57.788 kg)  SpO2: 99%  [facility] reading (PRIMARY) by  Dr.Daub there are increased interstitial markings bilaterally with a streaky infiltrate retrocardiac left base Results for orders placed in visit on 08/04/14  POCT CBC      Result Value Ref Range   WBC 6.6  4.6 - 10.2 K/uL   Lymph, poc 1.9  0.6 - 3.4   POC LYMPH PERCENT 28.1  10 - 50 %L   MID (cbc) 0.5  0 - 0.9   POC MID % 8.1  0 - 12 %M   POC Granulocyte 4.2  2 - 6.9   Granulocyte percent 63.8  37 - 80 %G   RBC 4.62  4.04 - 5.48 M/uL   Hemoglobin 13.7  12.2 - 16.2 g/dL   HCT, POC 44.1  37.7 - 47.9 %   MCV 95.4  80 - 97 fL   MCH, POC 29.6  27 - 31.2 pg   MCHC 31.0 (*) 31.8 - 35.4 g/dL   RDW, POC 16.0     Platelet Count, POC 315  142 - 424 K/uL   MPV 6.4  0 - 99.8 fL   EKG poor R-wave progression normal sinus rhythm. Meds ordered this encounter  Medications  . albuterol (PROVENTIL) (2.5 MG/3ML) 0.083% nebulizer solution 2.5 mg    Sig:   . ipratropium (ATROVENT) nebulizer solution 0.5 mg    Sig:   . ondansetron (ZOFRAN-ODT) 8 MG disintegrating tablet    Sig: One every 6-8 hours as needed for nausea    Dispense:  20 tablet    Refill:  0  . amLODipine (NORVASC) 2.5 MG tablet    Sig: Take 1 tablet (2.5 mg total) by mouth daily.    Dispense:  90 tablet    Refill:  3  . benzonatate (TESSALON) 100 MG capsule    Sig: Take 1-2 capsules  (100-200 mg total) by mouth 3 (three) times daily as needed for cough.    Dispense:  40 capsule    Refill:  0  . azithromycin (ZITHROMAX) 250 MG tablet    Sig: Take 2 a day for 1 day, then 1 a day for four days  Dispense:  6 tablet    Refill:  0  . albuterol (PROVENTIL HFA;VENTOLIN HFA) 108 (90 BASE) MCG/ACT inhaler    Sig: Inhale 2 puffs into the lungs every 6 (six) hours as needed for wheezing or shortness of breath.    Dispense:  1 Inhaler    Refill:  2      Assessment & Plan:  Patient here with a respiratory illness. Radiologist did not see a pneumonia. We'll cover with Zithromax albuterol HFA, Tessalon. I stopped her lisinopril. Switched her to amlodipine 2.5 one a day because of the cough issue  I personally performed the services described in this documentation, which was scribed in my presence. The recorded information has been reviewed and is accurate.

## 2014-09-18 DIAGNOSIS — L821 Other seborrheic keratosis: Secondary | ICD-10-CM | POA: Diagnosis not present

## 2014-09-18 DIAGNOSIS — D229 Melanocytic nevi, unspecified: Secondary | ICD-10-CM | POA: Diagnosis not present

## 2014-09-18 DIAGNOSIS — Z85828 Personal history of other malignant neoplasm of skin: Secondary | ICD-10-CM | POA: Diagnosis not present

## 2014-09-18 DIAGNOSIS — L719 Rosacea, unspecified: Secondary | ICD-10-CM | POA: Diagnosis not present

## 2014-09-18 DIAGNOSIS — L570 Actinic keratosis: Secondary | ICD-10-CM | POA: Diagnosis not present

## 2014-12-10 ENCOUNTER — Encounter: Payer: Self-pay | Admitting: Obstetrics & Gynecology

## 2014-12-10 ENCOUNTER — Ambulatory Visit (INDEPENDENT_AMBULATORY_CARE_PROVIDER_SITE_OTHER): Payer: Medicare Other | Admitting: Obstetrics & Gynecology

## 2014-12-10 VITALS — BP 130/72 | HR 64 | Resp 16 | Ht 60.5 in | Wt 127.8 lb

## 2014-12-10 DIAGNOSIS — Z0000 Encounter for general adult medical examination without abnormal findings: Secondary | ICD-10-CM | POA: Diagnosis not present

## 2014-12-10 DIAGNOSIS — Z1211 Encounter for screening for malignant neoplasm of colon: Secondary | ICD-10-CM | POA: Diagnosis not present

## 2014-12-10 DIAGNOSIS — Z124 Encounter for screening for malignant neoplasm of cervix: Secondary | ICD-10-CM | POA: Diagnosis not present

## 2014-12-10 DIAGNOSIS — Z01419 Encounter for gynecological examination (general) (routine) without abnormal findings: Secondary | ICD-10-CM | POA: Diagnosis not present

## 2014-12-10 LAB — POCT URINALYSIS DIPSTICK
Leukocytes, UA: NEGATIVE
Urobilinogen, UA: NEGATIVE
pH, UA: 5

## 2014-12-10 NOTE — Progress Notes (Signed)
69 y.o. G2P2 MarriedCaucasianF here for annual exam.  Doing well.  No vaginal bleeding.    PCP:  Dr. Lorelei Leonard.  Saw her last year.    No LMP recorded. Patient is postmenopausal.          Sexually active: No.  The current method of family planning is vasectomy.    Exercising: Yes.    tennis, golf, and pilates Smoker:  no  Health Maintenance: Pap:  08/29/12 WNL/negative HR HPV History of abnormal Pap:  yes MMG:  06/14/14 3D-normal Colonoscopy:  2011-repeat in 5-7 years, Dr. Earlean Leonard BMD:   06/14/14-1.4, -0.2 TDaP:  2006 Screening Labs: PCP, Hb today: PCP, Urine today: today   reports that she has never smoked. She has never used smokeless tobacco. She reports that she drinks about 3.5 oz of alcohol per week. She reports that she does not use illicit drugs.  Past Medical History  Diagnosis Date  . Anxiety   . Diverticulitis   . Renal cyst     stable CT 5/11 [place] 10/12  . Chlamydia     h/o  . Abnormal Pap smear of cervix   . Hypertension     Past Surgical History  Procedure Laterality Date  . Gynecologic cryosurgery      Current Outpatient Prescriptions  Medication Sig Dispense Refill  . ALPRAZolam (XANAX) 0.5 MG tablet Take 1 tablet (0.5 mg total) by mouth at bedtime as needed. 60 tablet 3  . amLODipine (NORVASC) 2.5 MG tablet Take 1 tablet (2.5 mg total) by mouth daily. 90 tablet 3  . ampicillin (PRINCIPEN) 500 MG capsule Take 500 mg by mouth daily. 5 x week    . aspirin 81 MG tablet Take 81 mg by mouth daily.    . B Complex Vitamins (VITAMIN B COMPLEX PO) Take by mouth daily.    Marland Kitchen CALCIUM PO Take by mouth. + magnesium and vitamin d daily    . Glucosamine HCl (GLUCOSAMINE PO) Take by mouth daily.    . Multiple Vitamins-Minerals (MULTIVITAMIN PO) Take by mouth daily.     No current facility-administered medications for this visit.    Family History  Problem Relation Age of Onset  . Heart disease Mother   . Breast cancer Mother 28    and stomach cancer  . Mitral valve  prolapse Mother   . Heart disease Father   . Prostate cancer Father   . Depression Daughter     ROS:  Pertinent items are noted in HPI.  Otherwise, a comprehensive ROS was negative.  Exam:   BP 130/72 mmHg  Pulse 64  Resp 16  Ht 5' 0.5" (1.537 m)  Wt 127 lb 12.8 oz (57.97 kg)  BMI 24.54 kg/m2  LMP   Weight change: -1#  Height: 5' 0.5" (153.7 cm)  Ht Readings from Last 3 Encounters:  12/10/14 5' 0.5" (1.537 m)  08/04/14 5\' 1"  (1.549 m)  04/23/14 5' 0.75" (1.543 m)    General appearance: alert, cooperative and appears stated age Head: Normocephalic, without obvious abnormality, atraumatic Neck: no adenopathy, supple, symmetrical, trachea midline and thyroid normal to inspection and palpation Lungs: clear to auscultation bilaterally Breasts: normal appearance, no masses or tenderness Heart: regular rate and rhythm Abdomen: soft, non-tender; bowel sounds normal; no masses,  no organomegaly Extremities: extremities normal, atraumatic, no cyanosis or edema Skin: Skin color, texture, turgor normal. No rashes or lesions Lymph nodes: Cervical, supraclavicular, and axillary nodes normal. No abnormal inguinal nodes palpated Neurologic: Grossly normal   Pelvic: External  genitalia:  no lesions              Urethra:  normal appearing urethra with no masses, tenderness or lesions              Bartholins and Skenes: normal                 Vagina: normal appearing vagina with normal color and discharge, no lesions              Cervix: no lesions              Pap taken: Yes.   Bimanual Exam:  Uterus:  normal size, contour, position, consistency, mobility, non-tender              Adnexa: normal adnexa and no mass, fullness, tenderness               Rectovaginal: Confirms               Anus:  normal sphincter tone, no lesions  Chaperone was present for exam.  A:  Well Woman with normal exam H/o severe dysplasia, 30 years H/o DDD in lumbar spine Vaginal dryness  P: Mammogram  yearly  pap smear obtained today Pt will do labs with Dr. Lorelei Leonard  Colonoscopy due "5-7" years.  Pt is going to wait until 7 years to do it.  IFOB given today. Pt aware Tdap due.  States will do with PCP. return annually or prn

## 2014-12-10 NOTE — Addendum Note (Signed)
Addended by: Alfonzo Feller on: 12/10/2014 02:29 PM   Modules accepted: Orders, SmartSet

## 2014-12-11 LAB — IPS PAP SMEAR ONLY

## 2014-12-13 ENCOUNTER — Other Ambulatory Visit: Payer: Self-pay | Admitting: Obstetrics & Gynecology

## 2014-12-13 DIAGNOSIS — F419 Anxiety disorder, unspecified: Secondary | ICD-10-CM

## 2014-12-13 NOTE — Telephone Encounter (Signed)
Medication refill request: Xanax 0.5 mg  Last AEX:  12/10/14 with SM  Next AEX: 02/2016 with SM  Last MMG (if hormonal medication request): N/A Refill authorized: Please advise.

## 2014-12-13 NOTE — Telephone Encounter (Signed)
Pharmacy calling for refill on pt's alprazolam medication.

## 2014-12-14 MED ORDER — ALPRAZOLAM 0.5 MG PO TABS: 0.5000 mg | ORAL_TABLET | Freq: Every evening | ORAL | Status: DC | PRN

## 2014-12-14 NOTE — Telephone Encounter (Signed)
Rx printed, signed by Dr. Sabra Heck and faxed to [facility].

## 2014-12-18 LAB — FECAL OCCULT BLOOD, IMMUNOCHEMICAL: IMMUNOLOGICAL FECAL OCCULT BLOOD TEST: NEGATIVE

## 2015-03-21 DIAGNOSIS — H524 Presbyopia: Secondary | ICD-10-CM | POA: Diagnosis not present

## 2015-03-21 DIAGNOSIS — H2513 Age-related nuclear cataract, bilateral: Secondary | ICD-10-CM | POA: Diagnosis not present

## 2015-03-21 DIAGNOSIS — H40013 Open angle with borderline findings, low risk, bilateral: Secondary | ICD-10-CM | POA: Diagnosis not present

## 2015-04-08 ENCOUNTER — Other Ambulatory Visit: Payer: Self-pay

## 2015-05-13 DIAGNOSIS — L821 Other seborrheic keratosis: Secondary | ICD-10-CM | POA: Diagnosis not present

## 2015-05-13 DIAGNOSIS — L570 Actinic keratosis: Secondary | ICD-10-CM | POA: Diagnosis not present

## 2015-06-18 DIAGNOSIS — K5792 Diverticulitis of intestine, part unspecified, without perforation or abscess without bleeding: Secondary | ICD-10-CM | POA: Diagnosis not present

## 2015-06-20 DIAGNOSIS — K5792 Diverticulitis of intestine, part unspecified, without perforation or abscess without bleeding: Secondary | ICD-10-CM | POA: Diagnosis not present

## 2015-08-02 ENCOUNTER — Ambulatory Visit (INDEPENDENT_AMBULATORY_CARE_PROVIDER_SITE_OTHER): Payer: Medicare Other

## 2015-08-02 DIAGNOSIS — Z23 Encounter for immunization: Secondary | ICD-10-CM

## 2015-08-16 ENCOUNTER — Telehealth: Payer: Self-pay | Admitting: Family Medicine

## 2015-08-16 NOTE — Telephone Encounter (Signed)
Spoke with patient about scheduling her CPE.  She will call back and schedule when her husband finishes chemo and radiation treatments.

## 2015-08-28 ENCOUNTER — Telehealth: Payer: Self-pay | Admitting: Obstetrics & Gynecology

## 2015-08-28 NOTE — Telephone Encounter (Signed)
I think she would benefit from a consultation.  Her husband is welcome to come with her but not necessary.  The HPV causing head/neck cancers is the same HPV that causes cervical cancer.  She was negative in 2013 and it appropriate to consider repeat testing now or with AEX.

## 2015-08-28 NOTE — Telephone Encounter (Signed)
Spoke with patient. Advised of message as seen below from [place]. Patient is agreeable. Appointment scheduled for 11/18 at 10 am with Dr.Miller. Agreeable to date and time.  Routing to provider for final review. Patient agreeable to disposition. Will close encounter.

## 2015-08-28 NOTE — Telephone Encounter (Signed)
Patient has some questions regarding her pap smear history. Patient is asking if she was tested for HPV. Last seen 12/10/14.

## 2015-08-28 NOTE — Telephone Encounter (Signed)
Spoke with patient. Patient states that her husband has just finished treatment for throat cancer that is "HPV driven." Patient was seen at the dentist last week and had a "sore" in her mouth. Her dentist recommended that she be seen again in 3 weeks to follow up with the sore due to her husbands history. Patient is asking if she has ever had HPV testing. Advised I have reviewed her previous pap smear. This year on 12/10/2014 no HPV testing was performed with pap. On 08/29/2012 HPV testing was done with pap and was negative. Advised this HPV testing is specific to the cervix. Patient is concerned with her husbands medical history about HPV. Would like me to discuss this with Dr.Miller to see if she has any additional recommendations for her prior to her upcoming aex in 11/2015. Aware Dr.Miller is out of the office today.

## 2015-08-30 ENCOUNTER — Ambulatory Visit (INDEPENDENT_AMBULATORY_CARE_PROVIDER_SITE_OTHER): Payer: Medicare Other | Admitting: Obstetrics & Gynecology

## 2015-08-30 VITALS — BP 142/82 | HR 64 | Resp 16 | Wt 127.0 lb

## 2015-08-30 DIAGNOSIS — Z202 Contact with and (suspected) exposure to infections with a predominantly sexual mode of transmission: Secondary | ICD-10-CM

## 2015-08-30 DIAGNOSIS — Z1151 Encounter for screening for human papillomavirus (HPV): Secondary | ICD-10-CM | POA: Diagnosis not present

## 2015-08-30 DIAGNOSIS — Z87410 Personal history of cervical dysplasia: Secondary | ICD-10-CM | POA: Diagnosis not present

## 2015-08-30 DIAGNOSIS — Z23 Encounter for immunization: Secondary | ICD-10-CM

## 2015-08-30 DIAGNOSIS — Z124 Encounter for screening for malignant neoplasm of cervix: Secondary | ICD-10-CM | POA: Diagnosis not present

## 2015-08-30 NOTE — Progress Notes (Signed)
Subjective:     Patient ID: Tammy Leonard, female   DOB: Nov 04, 1945, 69 y.o.   MRN: [id number]  HPI 69 yo G2P2 MWF here with her spouse who was recently treated for tongue cancer.  This was HPV positive and was stage 4.  He is doing well and has just completed therapy.  They are here for consultation regarding recommendations for her.  She has remote hx of severe dysplasia of the cervix treated, she thinks, with cryo.  Paps normalized after this point.  Pt did have neg pap and heg HR HPV 08/29/12.    HR HPV relationship to cervical cancer and mouth/throat cancers.  Higher prevalence of mouth/throat cancers in men discussed.  Low risk and high risk HPV strains and relationship to cancer discussed.  D/W pt she probably has fully suppressed the HR HPV and therefore if cervical testing remains negative, then probably mouth testing would be negative.  Currently, no oral screening is done.  HPV vaccination questions answered as well.    Pt needs updated tdap and would like to get it today.  Review of Systems  All other systems reviewed and are negative.      Objective:   Physical Exam  Constitutional: She appears well-developed and well-nourished.  Genitourinary: Vagina normal and uterus normal. There is no rash, tenderness, lesion or injury on the right labia. There is no rash, tenderness, lesion or injury on the left labia. Cervix exhibits no motion tenderness, no discharge and no friability.  Pap obtained.  Lymphadenopathy:       Right: No inguinal adenopathy present.       Left: No inguinal adenopathy present.       Assessment:     Spouse with HR HPV tongue cancer.  Pt and spouse here for consultation. Desires tdap today.     Plan:     Pap and HR HPV obtained.  All questions regarding HPV and exposure answered today. Tdap given today.        ~20 minutes spent with patient >50% of time was in face to face discussion of above.

## 2015-09-03 LAB — IPS PAP TEST WITH HPV

## 2015-09-09 ENCOUNTER — Other Ambulatory Visit: Payer: Self-pay

## 2015-09-09 DIAGNOSIS — R1032 Left lower quadrant pain: Secondary | ICD-10-CM | POA: Diagnosis not present

## 2015-09-09 DIAGNOSIS — Z1231 Encounter for screening mammogram for malignant neoplasm of breast: Secondary | ICD-10-CM

## 2015-10-10 ENCOUNTER — Ambulatory Visit
Admission: RE | Admit: 2015-10-10 | Discharge: 2015-10-10 | Disposition: A | Discharge status: Home / Self Care | Payer: Medicare Other | Source: Ambulatory Visit

## 2015-10-10 DIAGNOSIS — Z1231 Encounter for screening mammogram for malignant neoplasm of breast: Secondary | ICD-10-CM | POA: Diagnosis not present

## 2015-10-17 DIAGNOSIS — L719 Rosacea, unspecified: Secondary | ICD-10-CM | POA: Diagnosis not present

## 2015-10-17 DIAGNOSIS — L821 Other seborrheic keratosis: Secondary | ICD-10-CM | POA: Diagnosis not present

## 2015-10-17 DIAGNOSIS — D225 Melanocytic nevi of trunk: Secondary | ICD-10-CM | POA: Diagnosis not present

## 2015-10-17 DIAGNOSIS — Z23 Encounter for immunization: Secondary | ICD-10-CM | POA: Diagnosis not present

## 2015-10-17 DIAGNOSIS — Z85828 Personal history of other malignant neoplasm of skin: Secondary | ICD-10-CM | POA: Diagnosis not present

## 2015-10-22 ENCOUNTER — Other Ambulatory Visit: Payer: Self-pay | Admitting: Emergency Medicine

## 2015-10-30 ENCOUNTER — Encounter: Payer: Self-pay | Admitting: Family Medicine

## 2015-10-30 ENCOUNTER — Ambulatory Visit (INDEPENDENT_AMBULATORY_CARE_PROVIDER_SITE_OTHER): Payer: Medicare Other | Admitting: Family Medicine

## 2015-10-30 VITALS — BP 140/80 | HR 66 | Temp 98.5°F | Resp 16 | Ht 61.25 in | Wt 128.0 lb

## 2015-10-30 DIAGNOSIS — R7309 Other abnormal glucose: Secondary | ICD-10-CM

## 2015-10-30 DIAGNOSIS — F419 Anxiety disorder, unspecified: Secondary | ICD-10-CM

## 2015-10-30 DIAGNOSIS — M79641 Pain in right hand: Secondary | ICD-10-CM | POA: Diagnosis not present

## 2015-10-30 DIAGNOSIS — Z119 Encounter for screening for infectious and parasitic diseases, unspecified: Secondary | ICD-10-CM

## 2015-10-30 DIAGNOSIS — E785 Hyperlipidemia, unspecified: Secondary | ICD-10-CM

## 2015-10-30 DIAGNOSIS — Z131 Encounter for screening for diabetes mellitus: Secondary | ICD-10-CM | POA: Diagnosis not present

## 2015-10-30 DIAGNOSIS — I10 Essential (primary) hypertension: Secondary | ICD-10-CM

## 2015-10-30 DIAGNOSIS — Z5181 Encounter for therapeutic drug level monitoring: Secondary | ICD-10-CM

## 2015-10-30 DIAGNOSIS — Z23 Encounter for immunization: Secondary | ICD-10-CM | POA: Diagnosis not present

## 2015-10-30 DIAGNOSIS — Z13220 Encounter for screening for lipoid disorders: Secondary | ICD-10-CM

## 2015-10-30 LAB — CBC
HEMATOCRIT: 40.5 % (ref 36.0–46.0)
HEMOGLOBIN: 13.8 g/dL (ref 12.0–15.0)
MCH: 30.8 pg (ref 26.0–34.0)
MCHC: 34.1 g/dL (ref 30.0–36.0)
MCV: 90.4 fL (ref 78.0–100.0)
MPV: 9.3 fL (ref 8.6–12.4)
Platelets: 261 10*3/uL (ref 150–400)
RBC: 4.48 MIL/uL (ref 3.87–5.11)
RDW: 14 % (ref 11.5–15.5)
WBC: 6.7 10*3/uL (ref 4.0–10.5)

## 2015-10-30 LAB — COMPREHENSIVE METABOLIC PANEL
ALBUMIN: 4.4 g/dL (ref 3.6–5.1)
ALK PHOS: 49 U/L (ref 33–130)
ALT: 20 U/L (ref 6–29)
AST: 30 U/L (ref 10–35)
BUN: 19 mg/dL (ref 7–25)
CALCIUM: 9.9 mg/dL (ref 8.6–10.4)
CO2: 23 mmol/L (ref 20–31)
Chloride: 103 mmol/L (ref 98–110)
Creat: 0.86 mg/dL (ref 0.50–0.99)
Glucose, Bld: 86 mg/dL (ref 65–99)
POTASSIUM: 3.9 mmol/L (ref 3.5–5.3)
Sodium: 137 mmol/L (ref 135–146)
TOTAL PROTEIN: 6.8 g/dL (ref 6.1–8.1)
Total Bilirubin: 0.5 mg/dL (ref 0.2–1.2)

## 2015-10-30 LAB — LIPID PANEL
CHOL/HDL RATIO: 1.7 ratio (ref <=5.0)
CHOLESTEROL: 204 mg/dL — AB (ref 125–200)
HDL: 117 mg/dL (ref >=46)
LDL Cholesterol: 75 mg/dL (ref <130)
Triglycerides: 60 mg/dL (ref <150)
VLDL: 12 mg/dL (ref <30)

## 2015-10-30 NOTE — Progress Notes (Signed)
[facility] [address], [place] [address] [phone]  Date:  10/30/2015   Name:  Tammy Leonard   DOB:  11/18/1945   MRN:  [id number]  PCP:  Tammy Blinks, MD    Chief Complaint: Follow-up and Medication Refill   History of Present Illness:  Tammy Leonard is a 70 y.o. very pleasant female patient who presents with the following:  Here today to follow-up HTN and to have a check up Her husband is recently ill with a throat cancer so she has been put her own health on the back burner.   He is doing well and they are hopeful for his long term outlook She has not been checking her BP as much at home She is feeling well however  Her only concern is that she has noted some stiffness and tingling in her right long finger aobut 3 months ago.   It seems to be a little bit better, but the hand still feels stiff slightly in the morning and she sometimes has some tingling into the thumb and index finger as well She has not had any zinging sensation.   No weakness or problems holding things or playing tennis, golf, or doing yoga  Due for a prevnaar 13 She has been fasting for 7 hours since breakfst today She takes a very small dose of norvasc for borderline BP    BP Readings from Last 3 Encounters:  10/30/15 159/67  08/30/15 142/82  12/10/14 130/72     Patient Active Problem List   Diagnosis Date Noted  . Right hamstring muscle strain 08/22/2013  . Elevated blood pressure 08/22/2013    Past Medical History  Diagnosis Date  . Anxiety   . Diverticulitis   . Renal cyst     stable CT 5/11 [place] 10/12  . Chlamydia     h/o  . Abnormal Pap smear of cervix   . Hypertension     Past Surgical History  Procedure Laterality Date  . Gynecologic cryosurgery      Social History  Substance Use Topics  . Smoking status: Never Smoker   . Smokeless tobacco: Never Used  . Alcohol Use: 3.5 oz/week    7 drink(s) per week     Comment: glass of wine daily     Family History  Problem Relation Age of Onset  . Heart disease Mother   . Breast cancer Mother 45    and stomach cancer  . Mitral valve prolapse Mother   . Heart disease Father   . Prostate cancer Father   . Depression Daughter     Allergies  Allergen Reactions  . Compazine [Prochlorperazine Edisylate] Anxiety  . Lisinopril     Patient had a respiratory illness with cough and change made in her blood pressure medication. She did not have an allergic reaction or angioedema    Medication list has been reviewed and updated.  Current Outpatient Prescriptions on File Prior to Visit  Medication Sig Dispense Refill  . ALPRAZolam (XANAX) 0.5 MG tablet Take 1 tablet (0.5 mg total) by mouth at bedtime as needed. 30 tablet 1  . amLODipine (NORVASC) 2.5 MG tablet TAKE 1 TABLET DAILY 90 tablet 0  . ampicillin (PRINCIPEN) 500 MG capsule Take 500 mg by mouth daily. 5 x week    . B Complex Vitamins (VITAMIN B COMPLEX PO) Take by mouth daily.    Marland Kitchen CALCIUM PO Take by mouth. + magnesium and vitamin d daily    .  Glucosamine HCl (GLUCOSAMINE PO) Take by mouth daily.    . Multiple Vitamins-Minerals (MULTIVITAMIN PO) Take by mouth daily.    Marland Kitchen amoxicillin-clavulanate (AUGMENTIN) 875-125 MG tablet Reported on 10/30/2015     No current facility-administered medications on file prior to visit.    Review of Systems:  As per HPI- otherwise negative. No neck pain or stiffness  Physical Examination: Filed Vitals:   10/30/15 1503 10/30/15 1508  BP: 179/78 159/67  Pulse: 66   Temp: 98.5 F (36.9 C)   Resp: 16    Filed Vitals:   10/30/15 1503  Height: 5' 1.25" (1.556 m)  Weight: 128 lb (58.06 kg)   Body mass index is 23.98 kg/(m^2). Ideal Body Weight: Weight in (lb) to have BMI = 25: 133.1  GEN: WDWN, NAD, Non-toxic, A & O x 3, looks very well and healthy  HEENT: Atraumatic, Normocephalic. Neck supple. No masses, No LAD.  Bilateral TM wnl, oropharynx normal.  PEERL,EOMI.   Ears and Nose:  No external deformity. CV: RRR, No M/G/R. No JVD. No thrill. No extra heart sounds. PULM: CTA B, no wheezes, crackles, rhonchi. No retractions. No resp. distress. No accessory muscle use. EXTR: No c/c/e NEURO Normal gait.  PSYCH: Normally interactive. Conversant. Not depressed or anxious appearing.  Calm demeanor.  Right hand especially shows changes of likely OA in the DIP joints.  She did have some symptoms with Phalen's but negative Tinel's sign.  Normal strength of both hands and arms, normal sensation and ROM    Assessment and Plan: Right hand pain  Immunization due - Plan: Pneumococcal conjugate vaccine 13-valent IM  Essential hypertension  Screening for diabetes mellitus - Plan: Comprehensive metabolic panel, Hemoglobin A1c  Screening for hyperlipidemia - Plan: Lipid panel  Medication monitoring encounter - Plan: CBC, Comprehensive metabolic panel  Hyperlipidemia - Plan: Lipid panel  Elevated glucose - Plan: Hemoglobin A1c  Screening examination for infectious disease - Plan: Hepatitis C antibody  Here today for a follow-up visit Labs pending She will try a night splint for her wrist and let me know if not better She will monitor her BP and increase her norvasc if indicated  Signed Tammy Blinks, MD

## 2015-10-30 NOTE — Patient Instructions (Addendum)
Your blood pressure is borderline- check it a few times at home. If you consistently get numbers over 140/90 increase your amlodipine to 5 mg.   If you do end up increasing let me know so I can change your rx with the pharmacy  Try a wrist splint on the right hand at night and avoid yoga for a couple of weeks- if this does not help please let me know   You got your prevnar 13 pneumonia shot today  Have a wonderful trip to [facility] and [place]!

## 2015-10-31 LAB — HEPATITIS C ANTIBODY: HCV Ab: NEGATIVE

## 2015-10-31 LAB — HEMOGLOBIN A1C
Hgb A1c MFr Bld: 5.8 % — ABNORMAL HIGH (ref <5.7)
MEAN PLASMA GLUCOSE: 120 mg/dL — AB (ref <117)

## 2015-10-31 MED ORDER — ALPRAZOLAM 0.5 MG PO TABS: 0.5000 mg | ORAL_TABLET | Freq: Every evening | ORAL | Status: DC | PRN

## 2015-11-01 ENCOUNTER — Encounter: Payer: Self-pay | Admitting: Family Medicine

## 2015-11-06 ENCOUNTER — Encounter: Payer: Self-pay | Admitting: Family Medicine

## 2015-11-28 DIAGNOSIS — H25813 Combined forms of age-related cataract, bilateral: Secondary | ICD-10-CM | POA: Diagnosis not present

## 2015-11-28 DIAGNOSIS — H40013 Open angle with borderline findings, low risk, bilateral: Secondary | ICD-10-CM | POA: Diagnosis not present

## 2015-11-28 DIAGNOSIS — H5203 Hypermetropia, bilateral: Secondary | ICD-10-CM | POA: Diagnosis not present

## 2016-02-11 ENCOUNTER — Other Ambulatory Visit: Payer: Self-pay | Admitting: Emergency Medicine

## 2016-02-11 ENCOUNTER — Ambulatory Visit (INDEPENDENT_AMBULATORY_CARE_PROVIDER_SITE_OTHER): Payer: Medicare Other | Admitting: Obstetrics & Gynecology

## 2016-02-11 ENCOUNTER — Encounter: Payer: Self-pay | Admitting: Obstetrics & Gynecology

## 2016-02-11 VITALS — BP 138/60 | HR 64 | Resp 16 | Ht 60.5 in | Wt 128.0 lb

## 2016-02-11 DIAGNOSIS — Z0000 Encounter for general adult medical examination without abnormal findings: Secondary | ICD-10-CM | POA: Diagnosis not present

## 2016-02-11 DIAGNOSIS — Z124 Encounter for screening for malignant neoplasm of cervix: Secondary | ICD-10-CM

## 2016-02-11 DIAGNOSIS — Z01419 Encounter for gynecological examination (general) (routine) without abnormal findings: Secondary | ICD-10-CM

## 2016-02-11 LAB — POCT URINALYSIS DIPSTICK
Bilirubin, UA: NEGATIVE
Blood, UA: NEGATIVE
Glucose, UA: NEGATIVE
KETONES UA: NEGATIVE
Leukocytes, UA: NEGATIVE
Nitrite, UA: NEGATIVE
PH UA: 5
PROTEIN UA: NEGATIVE
Urobilinogen, UA: NEGATIVE

## 2016-02-11 NOTE — Progress Notes (Signed)
70 y.o. G2P2 MarriedCaucasianF here for annual exam.  Pt reports she is doing well.  Husband diagnosed last year with HPV related tongue cancer.  He just had recent CT and PET scan.  They have appointment tomorrow to get results.  Had neg Pap and neg HR HPV in 11/16.  Denies vaginal bleeding.    Patient's last menstrual period was 10/13/1999.          Sexually active: No.  The current method of family planning is vasectomy and post menopausal status.    Exercising: Yes.    Yoga, tennis, golf Smoker:  no  Health Maintenance: Pap:  08/30/15 Neg. HR HPV:neg History of abnormal Pap:  Yes, remote hx of Cervical dysplasia, Husband with HPV 16 related mouth cancer MMG: 10/11/15 BIRADS1:neg  Colonoscopy: 2011 - repeat 5-7 years Dr. Earlean Shawl  BMD:  06/14/14 Normal  TDaP:  08/30/15  Shingles:  Completed Hep C antibody testing completed 1/17 Screening Labs: PCP, Urine today: negative   reports that she has never smoked. She has never used smokeless tobacco. She reports that she drinks about 3.5 oz of alcohol per week. She reports that she does not use illicit drugs.  Past Medical History  Diagnosis Date  . Anxiety   . Diverticulitis   . Renal cyst     stable CT 5/11 [place] 10/12  . Chlamydia     h/o  . Abnormal Pap smear of cervix   . Hypertension     Past Surgical History  Procedure Laterality Date  . Gynecologic cryosurgery      Current Outpatient Prescriptions  Medication Sig Dispense Refill  . ALPRAZolam (XANAX) 0.5 MG tablet Take 1 tablet (0.5 mg total) by mouth at bedtime as needed. 30 tablet 0  . amLODipine (NORVASC) 2.5 MG tablet TAKE 1 TABLET DAILY 90 tablet 0  . ampicillin (PRINCIPEN) 500 MG capsule Take 500 mg by mouth daily. 5 x week    . B Complex Vitamins (VITAMIN B COMPLEX PO) Take by mouth daily.    Marland Kitchen CALCIUM PO Take by mouth. + magnesium and vitamin d daily    . Glucosamine HCl (GLUCOSAMINE PO) Take by mouth daily.    . Multiple Vitamins-Minerals (MULTIVITAMIN PO) Take  by mouth daily.     No current facility-administered medications for this visit.    Family History  Problem Relation Age of Onset  . Heart disease Mother   . Breast cancer Mother 31    and stomach cancer  . Mitral valve prolapse Mother   . Heart disease Father   . Prostate cancer Father   . Depression Daughter     ROS:  Pertinent items are noted in HPI.  Otherwise, a comprehensive ROS was negative.  Exam:   BP 138/60 mmHg  Pulse 64  Resp 16  Ht 5' 0.5" (1.537 m)  Wt 128 lb (58.06 kg)  BMI 24.58 kg/m2  LMP 10/13/1999  Weight change: +1#  Height: 5' 0.5" (153.7 cm)  Ht Readings from Last 3 Encounters:  02/11/16 5' 0.5" (1.537 m)  10/30/15 5' 1.25" (1.556 m)  12/10/14 5' 0.5" (1.537 m)   General appearance: alert, cooperative and appears stated age Head: Normocephalic, without obvious abnormality, atraumatic Neck: no adenopathy, supple, symmetrical, trachea midline and thyroid normal to inspection and palpation Lungs: clear to auscultation bilaterally Breasts: normal appearance, no masses or tenderness Heart: regular rate and rhythm Abdomen: soft, non-tender; bowel sounds normal; no masses,  no organomegaly Extremities: extremities normal, atraumatic, no cyanosis or edema  Skin: Skin color, texture, turgor normal. No rashes or lesions Lymph nodes: Cervical, supraclavicular, and axillary nodes normal. No abnormal inguinal nodes palpated Neurologic: Grossly normal   Pelvic: External genitalia:  no lesions              Urethra:  normal appearing urethra with no masses, tenderness or lesions              Bartholins and Skenes: normal                 Vagina: normal appearing vagina with normal color and discharge, no lesions              Cervix: no lesions              Pap taken: No. Bimanual Exam:  Uterus:  normal size, contour, position, consistency, mobility, non-tender              Adnexa: normal adnexa and no mass, fullness, tenderness               Rectovaginal:  Confirms               Anus:  normal sphincter tone, no lesions  Chaperone was present for exam.  A:  Well Woman with normal exam H/o severe dysplasia, 30 years ago H/o DDD in lumbar spine Vaginal dryness Husband with HPV related tongue cancer  P: Mammogram yearly  Pap smear and HR HPV testing negative 11/16 Labs/vaccines done with Dr. Lorelei Pont Pt aware colonoscopy is due.  She is postponing this right now due to husband's cancer issues.  IFOB given. Return annually or prn

## 2016-03-10 LAB — FECAL OCCULT BLOOD, IMMUNOCHEMICAL: IFOBT: NEGATIVE

## 2016-03-10 NOTE — Addendum Note (Signed)
Addended by: Susanne Greenhouse E on: 03/10/2016 11:09 AM   Modules accepted: Orders

## 2016-04-20 DIAGNOSIS — K5730 Diverticulosis of large intestine without perforation or abscess without bleeding: Secondary | ICD-10-CM | POA: Diagnosis not present

## 2016-04-20 DIAGNOSIS — K641 Second degree hemorrhoids: Secondary | ICD-10-CM | POA: Diagnosis not present

## 2016-04-20 DIAGNOSIS — K635 Polyp of colon: Secondary | ICD-10-CM | POA: Diagnosis not present

## 2016-04-20 DIAGNOSIS — Z1211 Encounter for screening for malignant neoplasm of colon: Secondary | ICD-10-CM | POA: Diagnosis not present

## 2016-04-20 DIAGNOSIS — D125 Benign neoplasm of sigmoid colon: Secondary | ICD-10-CM | POA: Diagnosis not present

## 2016-04-20 DIAGNOSIS — K648 Other hemorrhoids: Secondary | ICD-10-CM | POA: Diagnosis not present

## 2016-05-19 ENCOUNTER — Other Ambulatory Visit: Payer: Self-pay | Admitting: Family Medicine

## 2016-05-19 DIAGNOSIS — F419 Anxiety disorder, unspecified: Secondary | ICD-10-CM

## 2016-05-23 ENCOUNTER — Other Ambulatory Visit: Payer: Self-pay | Admitting: Emergency Medicine

## 2016-05-25 ENCOUNTER — Other Ambulatory Visit: Payer: Self-pay | Admitting: Emergency Medicine

## 2016-07-01 ENCOUNTER — Encounter: Payer: Self-pay | Admitting: Family Medicine

## 2016-07-06 ENCOUNTER — Ambulatory Visit (INDEPENDENT_AMBULATORY_CARE_PROVIDER_SITE_OTHER): Payer: Medicare Other | Admitting: Family Medicine

## 2016-07-06 ENCOUNTER — Encounter: Payer: Self-pay | Admitting: Family Medicine

## 2016-07-06 VITALS — BP 134/82 | HR 70 | Temp 98.3°F | Ht 60.5 in | Wt 127.2 lb

## 2016-07-06 DIAGNOSIS — G2581 Restless legs syndrome: Secondary | ICD-10-CM

## 2016-07-06 DIAGNOSIS — R7303 Prediabetes: Secondary | ICD-10-CM | POA: Diagnosis not present

## 2016-07-06 DIAGNOSIS — I10 Essential (primary) hypertension: Secondary | ICD-10-CM | POA: Diagnosis not present

## 2016-07-06 MED ORDER — AMLODIPINE BESYLATE 2.5 MG PO TABS: 2.5000 mg | ORAL_TABLET | Freq: Every day | ORAL | 3 refills | Status: DC

## 2016-07-06 NOTE — Progress Notes (Signed)
Pre visit review using our clinic review tool, if applicable. No additional management support is needed unless otherwise documented below in the visit note. 

## 2016-07-06 NOTE — Patient Instructions (Signed)
I will be in touch with your labs asap It was great to see you today- if you would like to move your physical back a few months that is fine

## 2016-07-06 NOTE — Progress Notes (Signed)
[facility] [address], [place], [place] [address] [phone]  Date:  07/06/2016   Name:  Tammy Leonard   DOB:  13-Nov-1945   MRN:  [id number]  PCP:  Tammy Blinks, MD    Chief Complaint: Medication Refill (Pt here for med refill. Declined flu vaccine today. )   History of Present Illness:  Tammy Leonard is a 70 y.o. very pleasant female patient who presents with the following:  Here today for a follow-up visit and BP med refill.  She uses norvasc for her HTN, uses xanax on occasion as needed Her derm gives her ampicillin for her roseacea.   BP Readings from Last 3 Encounters:  07/06/16 134/82  02/11/16 138/60  10/30/15 140/80   Last seen here in January- HPI at that visit  Here today to follow-up HTN and to have a check up Her husband is recently ill with a throat cancer so she has been put her own health on the back burner.   He is doing well and they are hopeful for his long term outlook She has not been checking her BP as much at home She is feeling well however  Her only concern is that she has noted some stiffness and tingling in her right long finger aobut 3 months ago.   It seems to be a little bit better, but the hand still feels stiff slightly in the morning and she sometimes has some tingling into the thumb and index finger as well She has not had any zinging sensation.   No weakness or problems holding things or playing tennis, golf, or doing yoga  Her husband Vicente Serene has throat cancer- he is doing very well however.  His pet scan is scheduled for November.   They recently went to [place] and had a good trip.   She has some RLS that she notes only on plane flights- would like to check a ferritin level to see if iron supplementation might be helpful  tdap was in November- she just had a grandson.    She is thrilled and the baby is doing well  She just had a colonosocpy that hsowed a benign  polyp  Patient Active Problem List   Diagnosis Date Noted  . Right hamstring muscle strain 08/22/2013  . Elevated blood pressure 08/22/2013    Past Medical History:  Diagnosis Date  . Abnormal Pap smear of cervix   . Anxiety   . Chlamydia    h/o  . Diverticulitis   . Hypertension   . Renal cyst    stable CT 5/11 [place] 10/12    Past Surgical History:  Procedure Laterality Date  . GYNECOLOGIC CRYOSURGERY      Social History  Substance Use Topics  . Smoking status: Never Smoker  . Smokeless tobacco: Never Used  . Alcohol use 3.5 oz/week    7 drink(s) per week     Comment: glass of wine daily    Family History  Problem Relation Age of Onset  . Heart disease Mother   . Breast cancer Mother 71    and stomach cancer  . Mitral valve prolapse Mother   . Heart disease Father   . Prostate cancer Father   . Depression Daughter     Allergies  Allergen Reactions  . Compazine [Prochlorperazine Edisylate] Anxiety  . Lisinopril     Patient had a respiratory illness with cough and change made in her  blood pressure medication. She did not have an allergic reaction or angioedema    Medication list has been reviewed and updated.  Current Outpatient Prescriptions on File Prior to Visit  Medication Sig Dispense Refill  . ALPRAZolam (XANAX) 0.5 MG tablet TAKE 1 TABLET AT BEDTIME AS NEEDED. 30 tablet 0  . amLODipine (NORVASC) 2.5 MG tablet TAKE 1 TABLET DAILY. 30 tablet 0  . ampicillin (PRINCIPEN) 500 MG capsule Take 500 mg by mouth daily. 5 x week    . B Complex Vitamins (VITAMIN B COMPLEX PO) Take by mouth daily.    Marland Kitchen CALCIUM PO Take by mouth. + magnesium and vitamin d daily    . Glucosamine HCl (GLUCOSAMINE PO) Take by mouth daily.    . Multiple Vitamins-Minerals (MULTIVITAMIN PO) Take by mouth daily.     No current facility-administered medications on file prior to visit.     Review of Systems:  As per HPI- otherwise negative.   Physical Examination: Vitals:    07/06/16 1545  BP: 134/82  Pulse: 70  Temp: 98.3 F (36.8 C)   Vitals:   07/06/16 1545  Weight: 127 lb 3.2 oz (57.7 kg)  Height: 5' 0.5" (1.537 m)   Body mass index is 24.43 kg/m. Ideal Body Weight: Weight in (lb) to have BMI = 25: 129.9  GEN: WDWN, NAD, Non-toxic, A & O x 3, normal weight, looks well, fit and active HEENT: Atraumatic, Normocephalic. Neck supple. No masses, No LAD. Ears and Nose: No external deformity. CV: RRR, No M/G/R. No JVD. No thrill. No extra heart sounds. PULM: CTA B, no wheezes, crackles, rhonchi. No retractions. No resp. distress. No accessory muscle use. EXTR: No c/c/e NEURO Normal gait.  PSYCH: Normally interactive. Conversant. Not depressed or anxious appearing.  Calm demeanor.    Assessment and Plan  Essential hypertension, benign - Plan: amLODipine (NORVASC) 2.5 MG tablet  Here today to discuss a few concerns- for some reason her other dx will not show above but we also discussed pre-diabetes, macrocytosis and possible RLS Labs pending- A1c, CBC, ferritin BP is controlled- continue current medication  Signed Tammy Blinks, MD

## 2016-07-07 LAB — HEMOGLOBIN A1C: HEMOGLOBIN A1C: 5.9 % (ref 4.6–6.5)

## 2016-07-07 LAB — CBC
HCT: 40.2 % (ref 36.0–46.0)
Hemoglobin: 13.4 g/dL (ref 12.0–15.0)
MCHC: 33.4 g/dL (ref 30.0–36.0)
MCV: 91.4 fl (ref 78.0–100.0)
Platelets: 320 10*3/uL (ref 150.0–400.0)
RBC: 4.4 Mil/uL (ref 3.87–5.11)
RDW: 13.8 % (ref 11.5–15.5)
WBC: 8.7 10*3/uL (ref 4.0–10.5)

## 2016-07-07 LAB — FERRITIN: FERRITIN: 58.1 ng/mL (ref 10.0–291.0)

## 2016-07-08 DIAGNOSIS — M19041 Primary osteoarthritis, right hand: Secondary | ICD-10-CM | POA: Diagnosis not present

## 2016-07-08 DIAGNOSIS — M19042 Primary osteoarthritis, left hand: Secondary | ICD-10-CM | POA: Diagnosis not present

## 2016-07-08 DIAGNOSIS — M79641 Pain in right hand: Secondary | ICD-10-CM | POA: Diagnosis not present

## 2016-07-08 DIAGNOSIS — M65311 Trigger thumb, right thumb: Secondary | ICD-10-CM | POA: Diagnosis not present

## 2016-07-29 DIAGNOSIS — M65311 Trigger thumb, right thumb: Secondary | ICD-10-CM | POA: Diagnosis not present

## 2016-08-21 ENCOUNTER — Ambulatory Visit: Payer: Medicare Other | Admitting: Obstetrics & Gynecology

## 2016-08-27 ENCOUNTER — Ambulatory Visit (INDEPENDENT_AMBULATORY_CARE_PROVIDER_SITE_OTHER): Payer: Medicare Other | Admitting: Obstetrics & Gynecology

## 2016-08-27 ENCOUNTER — Encounter: Payer: Medicare Other | Admitting: Family Medicine

## 2016-08-27 ENCOUNTER — Encounter: Payer: Self-pay | Admitting: Obstetrics & Gynecology

## 2016-08-27 VITALS — BP 148/72 | HR 72 | Resp 14 | Ht 61.0 in | Wt 129.0 lb

## 2016-08-27 DIAGNOSIS — Z9889 Other specified postprocedural states: Secondary | ICD-10-CM | POA: Diagnosis not present

## 2016-08-27 DIAGNOSIS — Z124 Encounter for screening for malignant neoplasm of cervix: Secondary | ICD-10-CM | POA: Diagnosis not present

## 2016-08-27 DIAGNOSIS — Z202 Contact with and (suspected) exposure to infections with a predominantly sexual mode of transmission: Secondary | ICD-10-CM | POA: Diagnosis not present

## 2016-08-27 NOTE — Progress Notes (Signed)
70 y.o. G2P2 Married CaucasianF here for repeat Pap smear at her request.  Pt's husband diagnosed with HPV related tongue cancer.  Yesterday, they found out he has positive lymph nodes in his chest that are likely progressive disease.  He is a candidate for radiation.  Will need needle biopsy.  Waiting to hear about all of that at this time.  Pt had negative pap and neg HR HPV six months ago at [facility].  Denies vaginal bleeding or other gyn symptoms.  Is just very anxious about her own health and wants to make sure everything is ok.  Pt does have remote hx of cryosurgery to her cervix.  Past Medical History:  Diagnosis Date  . Abnormal Pap smear of cervix   . Anxiety   . Chlamydia    h/o  . Diverticulitis   . Hypertension   . Renal cyst    stable CT 5/11 [place] 10/12   Past Surgical History:  Procedure Laterality Date  . GYNECOLOGIC CRYOSURGERY      Current Outpatient Prescriptions on File Prior to Visit  Medication Sig Dispense Refill  . ALPRAZolam (XANAX) 0.5 MG tablet TAKE 1 TABLET AT BEDTIME AS NEEDED. 30 tablet 0  . amLODipine (NORVASC) 2.5 MG tablet Take 1 tablet (2.5 mg total) by mouth daily. 90 tablet 3  . ampicillin (PRINCIPEN) 500 MG capsule Take 500 mg by mouth daily. 5 x week    . B Complex Vitamins (VITAMIN B COMPLEX PO) Take by mouth daily.    Marland Kitchen CALCIUM PO Take by mouth. + magnesium and vitamin d daily    . Glucosamine HCl (GLUCOSAMINE PO) Take by mouth daily.    . Multiple Vitamins-Minerals (MULTIVITAMIN PO) Take by mouth daily.     No current facility-administered medications on file prior to visit.    All:  Compazine, lisinopril  SH:  Married, non smoker  EXAM: BP (!) 148/72 (BP Location: Right Arm, Patient Position: Sitting, Cuff Size: Normal)   Pulse 72   Resp 14   Ht 5\' 1"  (1.549 m)   Wt 129 lb (58.5 kg)   LMP 10/13/1999   BMI 24.37 kg/m  General appearance:  WNWD Female, NAD Abd:  Soft, NT, ND, no masses, hernias, or organomegaly Pelvic exam:  VULVA:  normal appearing vulva with no masses, tenderness or lesions VAGINA: normal appearing vagina with normal color and discharge, no lesions CERVIX: normal appearing cervix without discharge or lesions UTERUS: uterus is normal size, shape, consistency and nontender ADNEXA: normal adnexa in size, nontender and no masses RECTAL: no visible lesions  PAP: Pap smear done today   Assessment: History of cryosurgery H/O HPV expsoure  Plan: As long as pap smear is normal, plan to repeat pap and HPV at [facility] six months.  At that point, will discuss possibly skipping a year before the next Pap smear.  Pt is open to this discussion.

## 2016-08-28 DIAGNOSIS — Z202 Contact with and (suspected) exposure to infections with a predominantly sexual mode of transmission: Secondary | ICD-10-CM | POA: Diagnosis not present

## 2016-08-28 DIAGNOSIS — Z9889 Other specified postprocedural states: Secondary | ICD-10-CM | POA: Diagnosis not present

## 2016-08-28 DIAGNOSIS — Z124 Encounter for screening for malignant neoplasm of cervix: Secondary | ICD-10-CM | POA: Diagnosis not present

## 2016-08-31 DIAGNOSIS — Z23 Encounter for immunization: Secondary | ICD-10-CM | POA: Diagnosis not present

## 2016-08-31 DIAGNOSIS — D485 Neoplasm of uncertain behavior of skin: Secondary | ICD-10-CM | POA: Diagnosis not present

## 2016-08-31 DIAGNOSIS — L570 Actinic keratosis: Secondary | ICD-10-CM | POA: Diagnosis not present

## 2016-08-31 LAB — IPS PAP SMEAR ONLY

## 2016-09-01 DIAGNOSIS — L821 Other seborrheic keratosis: Secondary | ICD-10-CM | POA: Diagnosis not present

## 2016-09-07 ENCOUNTER — Ambulatory Visit (INDEPENDENT_AMBULATORY_CARE_PROVIDER_SITE_OTHER): Payer: Medicare Other

## 2016-09-07 DIAGNOSIS — Z23 Encounter for immunization: Secondary | ICD-10-CM | POA: Diagnosis not present

## 2016-10-09 ENCOUNTER — Ambulatory Visit (INDEPENDENT_AMBULATORY_CARE_PROVIDER_SITE_OTHER): Payer: Medicare Other | Admitting: Family Medicine

## 2016-10-09 VITALS — BP 178/80 | HR 87 | Temp 99.3°F | Resp 18 | Ht 61.0 in | Wt 125.6 lb

## 2016-10-09 DIAGNOSIS — J209 Acute bronchitis, unspecified: Secondary | ICD-10-CM

## 2016-10-09 MED ORDER — AZITHROMYCIN 250 MG PO TABS: ORAL_TABLET | ORAL | 0 refills | Status: DC

## 2016-10-09 MED ORDER — HYDROCOD POLST-CPM POLST ER 10-8 MG/5ML PO SUER: 5.0000 mL | Freq: Two times a day (BID) | ORAL | 0 refills | Status: DC | PRN

## 2016-10-09 NOTE — Patient Instructions (Addendum)
-  Start Azithromycin Take 2 tabs x 1 dose, then 1 tab every day for x 4 days.  -For cough, chlorpheniramine-hydrocodone (Tussionex) 5 ml every 12 hours.  Medication causes drowsiness and or sedation.    IF you received an x-ray today, you will receive an invoice from [facility]. Please contact [facility] at [phone] with questions or concerns regarding your invoice.   IF you received labwork today, you will receive an invoice from [facility]. Please contact [facility] at [phone] with questions or concerns regarding your invoice.   Our billing staff will not be able to assist you with questions regarding bills from these companies.  You will be contacted with the lab results as soon as they are available. The fastest way to get your results is to activate your My Chart account. Instructions are located on the last page of this paperwork. If you have not heard from [place] regarding the results in 2 weeks, please contact this office.     Acute Bronchitis, Adult Acute bronchitis is when air tubes (bronchi) in the lungs suddenly get swollen. The condition can make it hard to breathe. It can also cause these symptoms:  A cough.  Coughing up clear, yellow, or green mucus.  Wheezing.  Chest congestion.  Shortness of breath.  A fever.  Body aches.  Chills.  A sore throat. Follow these instructions at home: Medicines  Take over-the-counter and prescription medicines only as told by your doctor.  If you were prescribed an antibiotic medicine, take it as told by your doctor. Do not stop taking the antibiotic even if you start to feel better. General instructions  Rest.  Drink enough fluids to keep your pee (urine) clear or pale yellow.  Avoid smoking and secondhand smoke. If you smoke and you need help quitting, ask your doctor. Quitting will help your lungs heal faster.  Use an inhaler, cool mist vaporizer, or humidifier as told by your doctor.  Keep all  follow-up visits as told by your doctor. This is important. How is this prevented? To lower your risk of getting this condition again:  Wash your hands often with soap and water. If you cannot use soap and water, use hand sanitizer.  Avoid contact with people who have cold symptoms.  Try not to touch your hands to your mouth, nose, or eyes.  Make sure to get the flu shot every year. Contact a doctor if:  Your symptoms do not get better in 2 weeks. Get help right away if:  You cough up blood.  You have chest pain.  You have very bad shortness of breath.  You become dehydrated.  You faint (pass out) or keep feeling like you are going to pass out.  You keep throwing up (vomiting).  You have a very bad headache.  Your fever or chills gets worse. This information is not intended to replace advice given to you by your health care provider. Make sure you discuss any questions you have with your health care provider. Document Released: 03/16/2008 Document Revised: 05/06/2016 Document Reviewed: 03/18/2016 Elsevier Interactive Patient Education  2017 [employer].

## 2016-10-09 NOTE — Progress Notes (Signed)
Patient ID: Tammy Leonard, female    DOB: 1945-12-23, 70 y.o.   MRN: [id number]  PCP: Lamar Blinks, MD  Chief Complaint  Patient presents with  . Cough    hx of bronchitis    Subjective:  HPI 70 year old female presents for evaluation of cough x 4 days. Cough in the chest , slightly heavy "sensation". Today she really began to feel bad . Has obtained an influenza vaccine for this flu season. Reports some shortness of breath,  pressure in chest, nasal congestion with drainage. Denies headache.  She has taken Mucinex DM and Vitamin C for 4 days without improvement of symptoms.   Social History   Social History  . Marital status: Married    Spouse name: N/A  . Number of children: N/A  . Years of education: N/A   Occupational History  . Not on file.   Social History Main Topics  . Smoking status: Never Smoker  . Smokeless tobacco: Never Used  . Alcohol use 3.5 oz/week    7 drink(s) per week     Comment: glass of wine daily  . Drug use: No  . Sexual activity: No     Comment: vasectomy   Other Topics Concern  . Not on file   Social History Narrative  . No narrative on file    Family History  Problem Relation Age of Onset  . Heart disease Mother   . Breast cancer Mother 87    and stomach cancer  . Mitral valve prolapse Mother   . Heart disease Father   . Prostate cancer Father   . Depression Daughter    Review of Systems SEE HPI  Patient Active Problem List   Diagnosis Date Noted  . History of cryosurgery 08/28/2016  . Right hamstring muscle strain 08/22/2013  . Elevated blood pressure 08/22/2013    Allergies  Allergen Reactions  . Compazine [Prochlorperazine Edisylate] Anxiety  . Lisinopril     Patient had a respiratory illness with cough and change made in her blood pressure medication. She did not have an allergic reaction or angioedema    Prior to Admission medications   Medication Sig Start Date End Date Taking? Authorizing Provider    ALPRAZolam Duanne Moron) 0.5 MG tablet TAKE 1 TABLET AT BEDTIME AS NEEDED. 05/19/16  Yes Gay Filler Copland, MD  amLODipine (NORVASC) 2.5 MG tablet Take 1 tablet (2.5 mg total) by mouth daily. 07/06/16  Yes Gay Filler Copland, MD  ampicillin (PRINCIPEN) 500 MG capsule Take 500 mg by mouth daily. 5 x week   Yes Historical Provider, MD  B Complex Vitamins (VITAMIN B COMPLEX PO) Take by mouth daily.   Yes Historical Provider, MD  CALCIUM PO Take by mouth. + magnesium and vitamin d daily   Yes Historical Provider, MD  Glucosamine HCl (GLUCOSAMINE PO) Take by mouth daily.   Yes Historical Provider, MD  Multiple Vitamins-Minerals (MULTIVITAMIN PO) Take by mouth daily.   Yes Historical Provider, MD    Past Medical, Surgical Family and Social History reviewed and updated.    Objective:   Today's Vitals   10/09/16 1753  BP: (!) 178/80  Pulse: 87  Resp: 18  Temp: 99.3 F (37.4 C)  TempSrc: Oral  SpO2: 98%  Weight: 125 lb 9.6 oz (57 kg)  Height: 5\' 1"  (1.549 m)    Wt Readings from Last 3 Encounters:  10/09/16 125 lb 9.6 oz (57 kg)  08/27/16 129 lb (58.5 kg)  07/06/16 127 lb 3.2  oz (57.7 kg)   Physical Exam  Constitutional: She is oriented to person, place, and time. She appears well-developed and well-nourished.  HENT:  Head: Normocephalic and atraumatic.  Nose: Mucosal edema and rhinorrhea present.  Mouth/Throat: No oropharyngeal exudate, posterior oropharyngeal edema, posterior oropharyngeal erythema or tonsillar abscesses.  Eyes: Conjunctivae are normal. Pupils are equal, round, and reactive to light.  Neck: Normal range of motion. Neck supple.  Cardiovascular: Normal rate, regular rhythm, normal heart sounds and intact distal pulses.   Pulmonary/Chest: Effort normal and breath sounds normal.  Musculoskeletal: Normal range of motion.  Lymphadenopathy:    She has no cervical adenopathy.  Neurological: She is alert and oriented to person, place, and time.  Skin: Skin is warm and dry.   Psychiatric: She has a normal mood and affect. Her behavior is normal. Judgment and thought content normal.       Assessment & Plan:  1. Acute bronchitis, unspecified organism,  . azithromycin (ZITHROMAX) 250 MG tablet    Sig: Take 2 tabs PO x 1 dose, then 1 tab PO QD x 4 days    Dispense:  6 tablet  . chlorpheniramine-HYDROcodone (TUSSIONEX PENNKINETIC ER) 10-8 MG/5ML SUER    Sig: Take 5 mLs by mouth every 12 (twelve) hours as needed for cough.    Dispense:  100 mL    Return for follow-up if symptoms worsen or do not improve.   Carroll Sage. Kenton Kingfisher, MSN, FNP-C [facility]

## 2016-11-16 ENCOUNTER — Other Ambulatory Visit: Payer: Self-pay | Admitting: Obstetrics & Gynecology

## 2016-11-16 DIAGNOSIS — Z1231 Encounter for screening mammogram for malignant neoplasm of breast: Secondary | ICD-10-CM

## 2016-11-18 ENCOUNTER — Ambulatory Visit (INDEPENDENT_AMBULATORY_CARE_PROVIDER_SITE_OTHER): Payer: Medicare Other | Admitting: Family Medicine

## 2016-11-18 ENCOUNTER — Ambulatory Visit (HOSPITAL_BASED_OUTPATIENT_CLINIC_OR_DEPARTMENT_OTHER)
Admission: RE | Admit: 2016-11-18 | Discharge: 2016-11-18 | Disposition: A | Discharge status: Home / Self Care | Payer: Medicare Other | Source: Ambulatory Visit | Attending: Family Medicine | Admitting: Family Medicine

## 2016-11-18 VITALS — BP 153/83 | HR 63 | Temp 99.1°F | Ht 61.0 in | Wt 128.4 lb

## 2016-11-18 DIAGNOSIS — I10 Essential (primary) hypertension: Secondary | ICD-10-CM

## 2016-11-18 DIAGNOSIS — R05 Cough: Secondary | ICD-10-CM | POA: Insufficient documentation

## 2016-11-18 DIAGNOSIS — R059 Cough, unspecified: Secondary | ICD-10-CM

## 2016-11-18 MED ORDER — IPRATROPIUM BROMIDE 0.03 % NA SOLN: 2.0000 | Freq: Four times a day (QID) | NASAL | 6 refills | Status: DC

## 2016-11-18 NOTE — Progress Notes (Signed)
[place] at [facility] [address], [place],  [address] [phone]  Date:  11/18/2016   Name:  Tammy Leonard   DOB:  07/08/1946   MRN:  [id number]  PCP:  Lamar Blinks, MD    Chief Complaint: Cough   History of Present Illness:  Tammy Leonard is a 71 y.o. very pleasant female patient who presents with the following:  She was at [facility] in December with a cough- treated with azithromycin and tussionex.    She feels ok and is back to her normal activities- however she is still coughing and was starting to get concerned  This is a bit better over the last 2 days- however her sx will wax and wane and the cough has gotten better only to worsen again over the last month  She had tussionex still and may use it on occasion as needed  No fever The cough is generally dry She has not noted any wheezing No ST Her ears do feel a bit congested- she tried to clean them out with a qtip which did help  She is going to visit her 35 month old grand-son soon in [place] and wanted to make sure that she is ok   No GI symptoms  She is on 2/.5 of amlodipine- she does check her BP at home and it is generally under good control.    We have been monitoring this as her BP can be high in the office.  Her home SBP is generally 135 or less Patient Active Problem List   Diagnosis Date Noted  . History of cryosurgery 08/28/2016  . Right hamstring muscle strain 08/22/2013  . Elevated blood pressure 08/22/2013    Past Medical History:  Diagnosis Date  . Abnormal Pap smear of cervix   . Anxiety   . Chlamydia    h/o  . Diverticulitis   . Hypertension   . Renal cyst    stable CT 5/11 [place] 10/12    Past Surgical History:  Procedure Laterality Date  . GYNECOLOGIC CRYOSURGERY      Social History  Substance Use Topics  . Smoking status: Never Smoker  . Smokeless tobacco: Never Used  . Alcohol use 3.5 oz/week    7 drink(s) per week     Comment:  glass of wine daily    Family History  Problem Relation Age of Onset  . Heart disease Mother   . Breast cancer Mother 70    and stomach cancer  . Mitral valve prolapse Mother   . Heart disease Father   . Prostate cancer Father   . Depression Daughter     Allergies  Allergen Reactions  . Compazine [Prochlorperazine Edisylate] Anxiety  . Lisinopril     Patient had a respiratory illness with cough and change made in her blood pressure medication. She did not have an allergic reaction or angioedema    Medication list has been reviewed and updated.  Current Outpatient Prescriptions on File Prior to Visit  Medication Sig Dispense Refill  . ALPRAZolam (XANAX) 0.5 MG tablet TAKE 1 TABLET AT BEDTIME AS NEEDED. 30 tablet 0  . amLODipine (NORVASC) 2.5 MG tablet Take 1 tablet (2.5 mg total) by mouth daily. 90 tablet 3  . ampicillin (PRINCIPEN) 500 MG capsule Take 500 mg by mouth daily. 5 x week    . B Complex Vitamins (VITAMIN B COMPLEX PO) Take by mouth daily.    Marland Kitchen CALCIUM  PO Take by mouth. + magnesium and vitamin d daily    . chlorpheniramine-HYDROcodone (TUSSIONEX PENNKINETIC ER) 10-8 MG/5ML SUER Take 5 mLs by mouth every 12 (twelve) hours as needed for cough. 100 mL 0  . Glucosamine HCl (GLUCOSAMINE PO) Take by mouth daily.    . Multiple Vitamins-Minerals (MULTIVITAMIN PO) Take by mouth daily.     No current facility-administered medications on file prior to visit.     Review of Systems:  As per HPI- otherwise negative. BP Readings from Last 3 Encounters:  11/18/16 (!) 153/83  10/09/16 (!) 178/80  08/27/16 (!) 148/72   No CP, SOB, fever, chills, nausea, vomiting, rash  At recent eye exam- no glaucoma  Physical Examination: Vitals:   11/18/16 1533  BP: (!) 153/83  Pulse: 63  Temp: 99.1 F (37.3 C)   Vitals:   11/18/16 1533  Weight: 128 lb 6.4 oz (58.2 kg)  Height: 5\' 1"  (1.549 m)   Body mass index is 24.26 kg/m. Ideal Body Weight: Weight in (lb) to have BMI = 25:  132  GEN: WDWN, NAD, Non-toxic, A & O x 3, looks well and healthy  HEENT: Atraumatic, Normocephalic. Neck supple. No masses, No LAD.  Bilateral TM wnl, oropharynx normal.  PEERL,EOMI.   Ears and Nose: No external deformity. CV: RRR, No M/G/R. No JVD. No thrill. No extra heart sounds. PULM: CTA B, no wheezes, crackles, rhonchi. No retractions. No resp. distress. No accessory muscle use. ABD: S, NT, ND, +BS. No rebound. No HSM. EXTR: No c/c/e NEURO Normal gait.  PSYCH: Normally interactive. Conversant. Not depressed or anxious appearing.  Calm demeanor.   Dg Chest 2 View  Result Date: 11/18/2016 CLINICAL DATA:  Cough, history of recent bronchitis EXAM: CHEST  2 VIEW COMPARISON:  Chest x-ray of 08/04/2014 FINDINGS: No active infiltrate or effusion is seen. Mediastinal and hilar contours are unremarkable. The heart is within normal limits in size. No bony abnormality is seen. IMPRESSION: No active cardiopulmonary disease. Electronically Signed   By: Ivar Drape M.D.   On: 11/18/2016 16:17    Assessment and Plan: Cough - Plan: DG Chest 2 View, ipratropium (ATROVENT) 0.03 % nasal spray  Essential hypertension, benign  Here today with a persistent cough Chest is clear and x-ray negative  Will try atrovent nasal for any element of PND She will let me know if not improving She will also continue to monitor her home BP and alert me if going up-= in that case will plan to increase her amlodipine   Signed Lamar Blinks, MD

## 2016-11-18 NOTE — Progress Notes (Signed)
Pre visit review using our clinic tool,if applicable. No additional management support is needed unless otherwise documented below in the visit note.  

## 2016-11-18 NOTE — Patient Instructions (Signed)
Try the atrovent nasal as needed and let me know if you are not getting better- Sooner if worse.

## 2016-12-07 ENCOUNTER — Telehealth: Payer: Self-pay | Admitting: *Deleted

## 2016-12-07 NOTE — Telephone Encounter (Signed)
AWV scheduled 12/10/16 @8 .

## 2016-12-09 NOTE — Progress Notes (Signed)
Subjective:   Tammy Leonard is a 71 y.o. female who presents for an Initial Medicare Annual Wellness Visit.  The Patient was informed that the wellness visit is to identify future health risk and educate and initiate measures that can reduce risk for increased disease through the lifespan.   Review of Systems    ROS No ROS.  Medicare Wellness Visit.  Cardiac Risk Factors include: hypertension;advanced age (>16men, >63 women) Sleep patterns: Sleeps 8 hrs/night. Wakes up feeling rested.  Home Safety/Smoke Alarms:  Feels safe in home. Smoke alarms in place.  Living environment; residence and Firearm Safety: 2 story house, 15 stairs, lives with husband. Seat Belt Safety/Bike Helmet: Wears seat belt.   Counseling:   Eye Exam- Yearly  Dental- Every 6 months.  Female:   Pap-  Last 6 months ago.Normal per pt. Dr Tammy Leonard.      Mammo- 10/10/2015 BI-RADS CATEGORY  1: Negative.  Scheduled for 12/11/16.     Dexa scan-      06/14/2014 - normal. Next in 2020 per Dr Tammy Leonard.  [facility]- 2017 Dr Tammy Leonard, Tammy Leonard - 1 polyp removed came back normal per pt. Recall 5 years.  Record request will be sent today.    Objective:    Today's Vitals   12/10/16 0809  BP: (!) 144/72  Pulse: 63  Resp: 14  Temp: 98.6 F (37 C)  TempSrc: Oral  SpO2: 98%  Weight: 129 lb 3.2 oz (58.6 kg)  Height: 5\' 1"  (1.549 m)   Body mass index is 24.41 kg/m.   Current Medications (verified) Outpatient Encounter Prescriptions as of 12/10/2016  Medication Sig  . ALPRAZolam (XANAX) 0.5 MG tablet TAKE 1 TABLET AT BEDTIME AS NEEDED.  Marland Kitchen amLODipine (NORVASC) 2.5 MG tablet Take 1 tablet (2.5 mg total) by mouth daily.  Marland Kitchen ampicillin (PRINCIPEN) 500 MG capsule Take 500 mg by mouth daily. 5 x week  . B Complex Vitamins (VITAMIN B COMPLEX PO) Take by mouth daily.  Marland Kitchen CALCIUM PO Take by mouth. + magnesium and vitamin d daily  . Glucosamine HCl (GLUCOSAMINE PO) Take by mouth daily.  . Multiple Vitamins-Minerals (MULTIVITAMIN  PO) Take by mouth daily.  . [DISCONTINUED] chlorpheniramine-HYDROcodone (TUSSIONEX PENNKINETIC ER) 10-8 MG/5ML SUER Take 5 mLs by mouth every 12 (twelve) hours as needed for cough.  . [DISCONTINUED] ipratropium (ATROVENT) 0.03 % nasal spray Place 2 sprays into the nose 4 (four) times daily. (Patient not taking: Reported on 12/10/2016)   No facility-administered encounter medications on file as of 12/10/2016.     Allergies (verified) Compazine [prochlorperazine edisylate] and Lisinopril   History: Past Leonard History:  Diagnosis Date  . Abnormal Pap smear of cervix   . Anxiety   . Chlamydia    h/o  . Diverticulitis   . Hypertension   . Renal cyst    stable CT 5/11 [place] 10/12   Past Surgical History:  Procedure Laterality Date  . GYNECOLOGIC CRYOSURGERY     Family History  Problem Relation Age of Onset  . Heart disease Mother   . Breast cancer Mother 35    and stomach cancer  . Mitral valve prolapse Mother   . Heart disease Father   . Prostate cancer Father   . Depression Daughter    Social History   Occupational History  . Not on file.   Social History Main Topics  . Smoking status: Never Smoker  . Smokeless tobacco: Never Used  . Alcohol use 3.5 oz/week    7  Standard drinks or equivalent per week     Comment: glass of wine daily  . Drug use: No  . Sexual activity: No     Comment: vasectomy    Tobacco Counseling Counseling given: Not Answered   Activities of Daily Living In your present state of health, do you have any difficulty performing the following activities: 12/10/2016  Hearing? N  Vision? N  Difficulty concentrating or making decisions? N  Walking or climbing stairs? N  Dressing or bathing? N  Doing errands, shopping? N  Preparing Food and eating ? N  Using the Toilet? N  In the past six months, have you accidently leaked urine? N  Do you have problems with loss of bowel control? N  Managing your Medications? N  Managing your Finances? N    Housekeeping or managing your Housekeeping? N  Some recent data might be hidden    Immunizations and Health Maintenance Immunization History  Administered Date(s) Administered  . Influenza Split 06/29/2012  . Influenza,inj,Quad PF,36+ Mos 08/02/2015, 09/07/2016  . Pneumococcal Conjugate-13 10/30/2015  . Pneumococcal Polysaccharide-23 09/05/2013  . Tdap 08/30/2015   There are no preventive care reminders to display for this patient.  Patient Care Team: Tammy Mclean, MD as PCP - General (Family Medicine)  Indicate any recent Leonard Services you may have received from other than [facility] providers in the past year (date may be approximate).     Assessment:   This is a routine wellness examination for Tammy Leonard. Physical assessment deferred to PCP.  Hearing/Vision screen Hearing Screening Comments: Able to hear conversational tones w/o difficulty. No issues reported.   Vision Screening Comments: Wears bifocals. Gets every year.   Dietary issues and exercise activities discussed: Current Exercise Habits: Structured exercise class, Type of exercise: strength training/weights;stretching;walking, Time (Minutes): > 60, Intensity: Moderate Diet (meal preparation, eat out, water intake, caffeinated beverages, dairy products, fruits and vegetables): in general, a "healthy" diet   Breakfast: Cereal and banana or yogurt. 1/5 cups coffee. Lunch: Sandwich.  Dinner:    [employer] 4-5 glasses water/day and green tea. Goals    . Lose 6 lbs.    . Maintain balance through yoga.       Depression Screen PHQ 2/9 Scores 12/10/2016 10/09/2016 10/30/2015 04/23/2014  PHQ - 2 Score 0 0 0 0    Fall Risk Fall Risk  12/10/2016 10/09/2016 10/30/2015 04/23/2014  Falls in the past year? No No No No    Cognitive Function:   Ad8 score reviewed for issues:  Issues making decisions: No  Less interest in hobbies / activities: No  Repeats questions, stories (family complaining): No  Trouble  using ordinary gadgets (microwave, computer, phone): No  Forgets the month or year: No  Mismanaging finances: No  Remembering appts: No  Daily problems with thinking and/or memory: No Ad8 score is=0       Screening Tests Health Maintenance  Topic Date Due  . MAMMOGRAM  10/09/2017  . COLONOSCOPY  07/17/2020  . TETANUS/TDAP  08/29/2025  . INFLUENZA VACCINE  Completed  . DEXA SCAN  Completed  . Hepatitis C Screening  Completed  . PNA vac Low Risk Adult  Completed      Plan:    Bring a copy of your advance directives to your next office visit.  During the course of the visit, Zylpha was educated and counseled about the following appropriate screening and preventive services:   Vaccines to include Pneumoccal, Influenza, Hepatitis B, Td, Zostavax, HCV  Cardiovascular  disease screening  Colorectal cancer screening  Bone density screening  Diabetes screening  Glaucoma screening  Mammography/PAP - scheduled for tomorrow.  Nutrition counseling  Smoking cessation counseling  Patient Instructions (the written plan) were given to the patient.    Ree Edman, RN   12/10/2016     I have reviewed above note by Ms. Albright and agree with her documentation

## 2016-12-09 NOTE — Progress Notes (Signed)
Pre visit review using our clinic review tool, if applicable. No additional management support is needed unless otherwise documented below in the visit note. 

## 2016-12-10 ENCOUNTER — Encounter: Payer: Self-pay | Admitting: Family Medicine

## 2016-12-10 ENCOUNTER — Ambulatory Visit (INDEPENDENT_AMBULATORY_CARE_PROVIDER_SITE_OTHER): Payer: Medicare Other | Admitting: Family Medicine

## 2016-12-10 VITALS — BP 144/72 | HR 63 | Temp 98.6°F | Resp 14 | Ht 61.0 in | Wt 129.2 lb

## 2016-12-10 DIAGNOSIS — Z0000 Encounter for general adult medical examination without abnormal findings: Secondary | ICD-10-CM

## 2016-12-10 DIAGNOSIS — R635 Abnormal weight gain: Secondary | ICD-10-CM | POA: Diagnosis not present

## 2016-12-10 DIAGNOSIS — R7309 Other abnormal glucose: Secondary | ICD-10-CM | POA: Diagnosis not present

## 2016-12-10 DIAGNOSIS — Z5181 Encounter for therapeutic drug level monitoring: Secondary | ICD-10-CM | POA: Diagnosis not present

## 2016-12-10 DIAGNOSIS — Z13220 Encounter for screening for lipoid disorders: Secondary | ICD-10-CM

## 2016-12-10 DIAGNOSIS — Z130 Encounter for screening for diseases of the blood and blood-forming organs and certain disorders involving the immune mechanism: Secondary | ICD-10-CM

## 2016-12-10 DIAGNOSIS — Z131 Encounter for screening for diabetes mellitus: Secondary | ICD-10-CM

## 2016-12-10 DIAGNOSIS — F419 Anxiety disorder, unspecified: Secondary | ICD-10-CM

## 2016-12-10 DIAGNOSIS — E7800 Pure hypercholesterolemia, unspecified: Secondary | ICD-10-CM | POA: Diagnosis not present

## 2016-12-10 LAB — CBC
HCT: 42.3 % (ref 36.0–46.0)
HEMOGLOBIN: 14 g/dL (ref 12.0–15.0)
MCHC: 33.1 g/dL (ref 30.0–36.0)
MCV: 92.8 fl (ref 78.0–100.0)
PLATELETS: 276 10*3/uL (ref 150.0–400.0)
RBC: 4.56 Mil/uL (ref 3.87–5.11)
RDW: 14.2 % (ref 11.5–15.5)
WBC: 6.9 10*3/uL (ref 4.0–10.5)

## 2016-12-10 LAB — LIPID PANEL
CHOL/HDL RATIO: 2
Cholesterol: 222 mg/dL — ABNORMAL HIGH (ref 0–200)
HDL: 103.9 mg/dL (ref >39.00)
LDL CALC: 107 mg/dL — AB (ref 0–99)
NonHDL: 118.55
TRIGLYCERIDES: 56 mg/dL (ref 0.0–149.0)
VLDL: 11.2 mg/dL (ref 0.0–40.0)

## 2016-12-10 LAB — COMPREHENSIVE METABOLIC PANEL
ALT: 23 U/L (ref 0–35)
AST: 28 U/L (ref 0–37)
Albumin: 4.5 g/dL (ref 3.5–5.2)
Alkaline Phosphatase: 49 U/L (ref 39–117)
BUN: 22 mg/dL (ref 6–23)
CALCIUM: 10 mg/dL (ref 8.4–10.5)
CHLORIDE: 107 meq/L (ref 96–112)
CO2: 29 meq/L (ref 19–32)
Creatinine, Ser: 0.89 mg/dL (ref 0.40–1.20)
GFR: 66.88 mL/min (ref >60.00)
Glucose, Bld: 113 mg/dL — ABNORMAL HIGH (ref 70–99)
POTASSIUM: 4 meq/L (ref 3.5–5.1)
Sodium: 141 mEq/L (ref 135–145)
Total Bilirubin: 0.4 mg/dL (ref 0.2–1.2)
Total Protein: 7.3 g/dL (ref 6.0–8.3)

## 2016-12-10 LAB — TSH: TSH: 0.57 u[IU]/mL (ref 0.35–4.50)

## 2016-12-10 LAB — HEMOGLOBIN A1C: Hgb A1c MFr Bld: 5.9 % (ref 4.6–6.5)

## 2016-12-10 MED ORDER — ALPRAZOLAM 0.5 MG PO TABS: 0.5000 mg | ORAL_TABLET | Freq: Every evening | ORAL | 0 refills | Status: DC | PRN

## 2016-12-10 NOTE — Progress Notes (Signed)
[place] at [facility] [address] [place], [place] [address] [phone]  Date:  12/10/2016   Name:  Tammy Leonard   DOB:  11-Aug-1946   MRN:  [id number]  PCP:  Lamar Blinks, MD    Chief Complaint: Medicare Wellness   History of Present Illness:  Tammy Leonard is a 71 y.o. very pleasant female patient who presents with the following:  Her for medicare wellness exam today with RN and also a recheck/ PE today. She is s/p pneumovax, prevnar, tdap and UTD on flu shot, also had zostavax in the past   mammo is UTD Colonoscopy 2011 dexa scan 2015 Hep c screening done  She recently saw her new grandchild- the family is doing well overall  She is fasting today for labs  mammo tomorrow She still sees OBG for her paps, etc Her husband has recurrent metestatic cancer of the throat but he is doing very well with immunotherapy  BP Readings from Last 3 Encounters:  12/10/16 (!) 144/72  11/18/16 (!) 153/83  10/09/16 (!) 178/80   She is on amlodipine for her BP- 2.5 mg daily  She does monitor her BP at home and generally gets favorable numbers She did gain a few lbs recently on a trip to [place] to take care of their grandchild - she will work on losing this again  Patient Active Problem List   Diagnosis Date Noted  . History of cryosurgery 08/28/2016  . Right hamstring muscle strain 08/22/2013  . Elevated blood pressure 08/22/2013    Past Medical History:  Diagnosis Date  . Abnormal Pap smear of cervix   . Anxiety   . Chlamydia    h/o  . Diverticulitis   . Hypertension   . Renal cyst    stable CT 5/11 [place] 10/12    Past Surgical History:  Procedure Laterality Date  . GYNECOLOGIC CRYOSURGERY      Social History  Substance Use Topics  . Smoking status: Never Smoker  . Smokeless tobacco: Never Used  . Alcohol use 3.5 oz/week    7 Standard drinks or equivalent per week     Comment: glass of wine daily     Family History  Problem Relation Age of Onset  . Heart disease Mother   . Breast cancer Mother 60    and stomach cancer  . Mitral valve prolapse Mother   . Heart disease Father   . Prostate cancer Father   . Depression Daughter     Allergies  Allergen Reactions  . Compazine [Prochlorperazine Edisylate] Anxiety  . Lisinopril     Patient had a respiratory illness with cough and change made in her blood pressure medication. She did not have an allergic reaction or angioedema    Medication list has been reviewed and updated.  Current Outpatient Prescriptions on File Prior to Visit  Medication Sig Dispense Refill  . ALPRAZolam (XANAX) 0.5 MG tablet TAKE 1 TABLET AT BEDTIME AS NEEDED. 30 tablet 0  . amLODipine (NORVASC) 2.5 MG tablet Take 1 tablet (2.5 mg total) by mouth daily. 90 tablet 3  . ampicillin (PRINCIPEN) 500 MG capsule Take 500 mg by mouth daily. 5 x week    . B Complex Vitamins (VITAMIN B COMPLEX PO) Take by mouth daily.    Marland Kitchen CALCIUM PO Take by mouth. + magnesium and vitamin d daily    . Glucosamine HCl (GLUCOSAMINE PO) Take by mouth daily.    Marland Kitchen  Multiple Vitamins-Minerals (MULTIVITAMIN PO) Take by mouth daily.     No current facility-administered medications on file prior to visit.     Review of Systems:  As per HPI- otherwise negative.   Physical Examination: Vitals:   12/10/16 0809  BP: (!) 144/72  Pulse: 63  Resp: 14  Temp: 98.6 F (37 C)   Vitals:   12/10/16 0809  Weight: 129 lb 3.2 oz (58.6 kg)  Height: 5\' 1"  (1.549 m)   Body mass index is 24.41 kg/m. Ideal Body Weight: Weight in (lb) to have BMI = 25: 132  GEN: WDWN, NAD, Non-toxic, A & O x 3, normal weight, looks well and very fit for age  HEENT: Atraumatic, Normocephalic. Neck supple. No masses, No LAD. Ears and Nose: No external deformity. CV: RRR, No M/G/R. No JVD. No thrill. No extra heart sounds. PULM: CTA B, no wheezes, crackles, rhonchi. No retractions. No resp. distress. No  accessory muscle use. ABD: S, NT, ND. No rebound. No HSM. EXTR: No c/c/e NEURO Normal gait.  PSYCH: Normally interactive. Conversant. Not depressed or anxious appearing.  Calm demeanor.    Assessment and Plan: Encounter for Medicare annual wellness exam  Screening for deficiency anemia - Plan: CBC  Screening for diabetes mellitus - Plan: Comprehensive metabolic panel, Hemoglobin A1c  Screening for hyperlipidemia - Plan: Lipid panel  Anxiety - Plan: ALPRAZolam (XANAX) 0.5 MG tablet  Elevated glucose - Plan: Lipid panel, Hemoglobin A1c  Weight gain - Plan: Lipid panel, TSH  Medication monitoring encounter - Plan: CBC, Lipid panel  Pure hypercholesterolemia - Plan: Lipid panel  Medicare wellness exam today- have reviewed note by Ms. Riki Sheer, RN and agree with her documentation.   Labs pending as above She uses xanax on occasion when she cannot sleep, last filled several months ago  [place]- only entry is some cough syrup from December of 2017  Will plan further follow- up pending labs.   Signed Lamar Blinks, MD

## 2016-12-10 NOTE — Patient Instructions (Addendum)
Bring a copy of your advance directives to your next office visit.  It was great to see you today- take care!  I will be in touch with your labs asap Continue your healthy and active lifestyle

## 2016-12-11 ENCOUNTER — Ambulatory Visit
Admission: RE | Admit: 2016-12-11 | Discharge: 2016-12-11 | Disposition: A | Discharge status: Home / Self Care | Payer: Medicare Other | Source: Ambulatory Visit | Attending: Obstetrics & Gynecology | Admitting: Obstetrics & Gynecology

## 2016-12-11 DIAGNOSIS — Z1231 Encounter for screening mammogram for malignant neoplasm of breast: Secondary | ICD-10-CM

## 2016-12-14 ENCOUNTER — Other Ambulatory Visit: Payer: Self-pay | Admitting: Obstetrics & Gynecology

## 2016-12-14 DIAGNOSIS — R928 Other abnormal and inconclusive findings on diagnostic imaging of breast: Secondary | ICD-10-CM

## 2016-12-16 ENCOUNTER — Encounter: Payer: Self-pay | Admitting: Family Medicine

## 2016-12-16 DIAGNOSIS — Z8719 Personal history of other diseases of the digestive system: Secondary | ICD-10-CM | POA: Insufficient documentation

## 2016-12-17 DIAGNOSIS — H25813 Combined forms of age-related cataract, bilateral: Secondary | ICD-10-CM | POA: Diagnosis not present

## 2016-12-17 DIAGNOSIS — H5203 Hypermetropia, bilateral: Secondary | ICD-10-CM | POA: Diagnosis not present

## 2016-12-17 DIAGNOSIS — H40013 Open angle with borderline findings, low risk, bilateral: Secondary | ICD-10-CM | POA: Diagnosis not present

## 2016-12-18 ENCOUNTER — Ambulatory Visit
Admission: RE | Admit: 2016-12-18 | Discharge: 2016-12-18 | Disposition: A | Discharge status: Home / Self Care | Payer: Medicare Other | Source: Ambulatory Visit | Attending: Obstetrics & Gynecology | Admitting: Obstetrics & Gynecology

## 2016-12-18 ENCOUNTER — Other Ambulatory Visit: Payer: Medicare Other

## 2016-12-18 DIAGNOSIS — R928 Other abnormal and inconclusive findings on diagnostic imaging of breast: Secondary | ICD-10-CM

## 2017-01-05 DIAGNOSIS — L738 Other specified follicular disorders: Secondary | ICD-10-CM | POA: Diagnosis not present

## 2017-01-05 DIAGNOSIS — L570 Actinic keratosis: Secondary | ICD-10-CM | POA: Diagnosis not present

## 2017-01-05 DIAGNOSIS — Z85828 Personal history of other malignant neoplasm of skin: Secondary | ICD-10-CM | POA: Diagnosis not present

## 2017-01-05 DIAGNOSIS — Z23 Encounter for immunization: Secondary | ICD-10-CM | POA: Diagnosis not present

## 2017-01-05 DIAGNOSIS — L719 Rosacea, unspecified: Secondary | ICD-10-CM | POA: Diagnosis not present

## 2017-01-05 DIAGNOSIS — D235 Other benign neoplasm of skin of trunk: Secondary | ICD-10-CM | POA: Diagnosis not present

## 2017-02-11 ENCOUNTER — Telehealth: Payer: Self-pay | Admitting: Obstetrics & Gynecology

## 2017-02-11 NOTE — Telephone Encounter (Signed)
Left patient a message to call back to reschedule a future appointment that was cancelled by the provider. °

## 2017-02-15 ENCOUNTER — Telehealth: Payer: Self-pay | Admitting: Obstetrics & Gynecology

## 2017-02-15 NOTE — Telephone Encounter (Signed)
I put in my note last time that I'd do a pap and HR HPV at this AEX and then skip a year but it is also fine to wait a year between pap smears and just do the AEX with pap in six months.  Either way is fine.  She just saw her PCP so waiting until the fall would be just fine.  Thanks.

## 2017-02-15 NOTE — Telephone Encounter (Signed)
Spoke with patient. Patient states that she was scheduled for an aex on 02/19/2017, but has a 6 month pap scheduled for 08/24/2017 with Dr.Miller. Asking if she can wait until 08/24/2017 appointment to see Dr.Miller or if she needs both appointments. Advised will review with Dr.Miller and return call. Patient is agreeable.  Dr.Miller okay to turn 11/13 appointment into an aex with pap?

## 2017-02-15 NOTE — Telephone Encounter (Signed)
Patient returned call to reschedule her AEX with Dr. Sabra Heck that was cancelled by the provider. The patient requested to speak with the nurse to be sure she needs this appointment. She wants to discuss her medical history with the nurse prior to rescheduling.

## 2017-02-15 NOTE — Telephone Encounter (Signed)
Left message to call Tammy Leonard at [phone]. 

## 2017-02-15 NOTE — Telephone Encounter (Signed)
Patient is returning a call to Kaitlyn. °

## 2017-02-16 NOTE — Telephone Encounter (Signed)
Spoke with patient. Advised of message as seen below from Titus. Patient verbalizes understanding and would like to wait until November 2018 to do aex with pap. Appointment for 08/24/2017 changed to aex with pap. Patient is agreeable.  Routing to provider for final review. Patient agreeable to disposition. Will close encounter.

## 2017-02-16 NOTE — Telephone Encounter (Signed)
Left message to call Kaitlyn at [phone]. 

## 2017-02-19 ENCOUNTER — Ambulatory Visit: Payer: Self-pay | Admitting: Obstetrics & Gynecology

## 2017-06-10 ENCOUNTER — Telehealth: Payer: Self-pay | Admitting: Family Medicine

## 2017-06-10 DIAGNOSIS — F419 Anxiety disorder, unspecified: Secondary | ICD-10-CM

## 2017-06-15 MED ORDER — ALPRAZOLAM 0.5 MG PO TABS: 0.5000 mg | ORAL_TABLET | Freq: Every evening | ORAL | 1 refills | Status: DC | PRN

## 2017-06-15 NOTE — Telephone Encounter (Signed)
[facility]: no entries last 6 months.  Will refill for her

## 2017-06-15 NOTE — Telephone Encounter (Signed)
Pt is requesting refill on alprazolam 0.5mg .   Last OV: 11/18/2016 Last Fill: 12/10/2016 #30 and 0RF   Please advise.

## 2017-08-10 ENCOUNTER — Other Ambulatory Visit: Payer: Self-pay | Admitting: Family Medicine

## 2017-08-10 DIAGNOSIS — I10 Essential (primary) hypertension: Secondary | ICD-10-CM

## 2017-08-24 ENCOUNTER — Ambulatory Visit (INDEPENDENT_AMBULATORY_CARE_PROVIDER_SITE_OTHER): Payer: Medicare Other | Admitting: Obstetrics & Gynecology

## 2017-08-24 ENCOUNTER — Other Ambulatory Visit: Payer: Self-pay

## 2017-08-24 ENCOUNTER — Encounter: Payer: Self-pay | Admitting: Obstetrics & Gynecology

## 2017-08-24 VITALS — BP 138/82 | HR 68 | Resp 16 | Ht 60.5 in | Wt 129.0 lb

## 2017-08-24 DIAGNOSIS — Z124 Encounter for screening for malignant neoplasm of cervix: Secondary | ICD-10-CM

## 2017-08-24 DIAGNOSIS — Z01419 Encounter for gynecological examination (general) (routine) without abnormal findings: Secondary | ICD-10-CM

## 2017-08-24 NOTE — Progress Notes (Signed)
71 y.o. G2P2 MarriedCaucasianF here for annual exam.  Doing well.  Husband has recurrent HPV related head/neck  cancer.  He is doing immunotherapy and having radiation on two lymph nodes in his chest.    Denies vaginal bleeding.  PCP:  Lamar Blinks.  Had blood work earlier this year.   Patient's last menstrual period was 10/13/1999.          Sexually active: No.  The current method of family planning is post menopausal status.    Exercising: Yes.    tennis, yoga Smoker:  no  Health Maintenance: Pap:  08/28/16 Neg   08/30/15 Neg. HR HPV:neg  History of abnormal Pap:  Yes, remote hx of cervical dysplasia. MMG:  12/18/16 Diagnostic Right BIRADS1:neg. F/u 1 year  Colonoscopy: 2018 1 Polyp. F/u 5 years  BMD:  06/15/14 Normal  TDaP:  2016  Pneumonia vaccine(s):  2017  Zostavax: 2012 Hep C testing: 10/30/15 Neg  Screening Labs: PCP   reports that  has never smoked. she has never used smokeless tobacco. She reports that she drinks about 3.5 oz of alcohol per week. She reports that she does not use drugs.  Past Medical History:  Diagnosis Date  . Abnormal Pap smear of cervix   . Anxiety   . Chlamydia    h/o  . Diverticulitis   . Hypertension   . Renal cyst    stable CT 5/11 [place] 10/12    Past Surgical History:  Procedure Laterality Date  . GYNECOLOGIC CRYOSURGERY      Current Outpatient Medications  Medication Sig Dispense Refill  . ALPRAZolam (XANAX) 0.5 MG tablet Take 1 tablet (0.5 mg total) by mouth at bedtime as needed. 30 tablet 1  . amLODipine (NORVASC) 2.5 MG tablet TAKE 1 TABLET DAILY. 90 tablet 0  . ampicillin (PRINCIPEN) 500 MG capsule Take 500 mg by mouth daily. 5 x week    . B Complex Vitamins (VITAMIN B COMPLEX PO) Take by mouth daily.    Marland Kitchen CALCIUM PO Take by mouth. + magnesium and vitamin d daily    . Glucosamine HCl (GLUCOSAMINE PO) Take by mouth daily.    . hyoscyamine (LEVBID) 0.375 MG 12 hr tablet Take 0.375 mg daily by mouth.    . Multiple Vitamins-Minerals  (MULTIVITAMIN PO) Take by mouth daily.     No current facility-administered medications for this visit.     Family History  Problem Relation Age of Onset  . Heart disease Mother   . Breast cancer Mother 5       and stomach cancer  . Mitral valve prolapse Mother   . Heart disease Father   . Prostate cancer Father   . Depression Daughter     ROS:  Pertinent items are noted in HPI.  Otherwise, a comprehensive ROS was negative.  Exam:   BP 138/82 (BP Location: Right Arm, Patient Position: Sitting, Cuff Size: Normal)   Pulse 68   Resp 16   Ht 5' 0.5" (1.537 m)   Wt 129 lb (58.5 kg)   LMP 10/13/1999   BMI 24.78 kg/m     Height: 5' 0.5" (153.7 cm)  Ht Readings from Last 3 Encounters:  08/24/17 5' 0.5" (1.537 m)  12/10/16 5\' 1"  (1.549 m)  11/18/16 5\' 1"  (1.549 m)    General appearance: alert, cooperative and appears stated age Head: Normocephalic, without obvious abnormality, atraumatic Neck: no adenopathy, supple, symmetrical, trachea midline and thyroid normal to inspection and palpation Lungs: clear to auscultation bilaterally Breasts:  normal appearance, no masses or tenderness Heart: regular rate and rhythm Abdomen: soft, non-tender; bowel sounds normal; no masses,  no organomegaly Extremities: extremities normal, atraumatic, no cyanosis or edema Skin: Skin color, texture, turgor normal. No rashes or lesions Lymph nodes: Cervical, supraclavicular, and axillary nodes normal. No abnormal inguinal nodes palpated Neurologic: Grossly normal   Pelvic: External genitalia:  no lesions              Urethra:  normal appearing urethra with no masses, tenderness or lesions              Bartholins and Skenes: normal                 Vagina: normal appearing vagina with normal color and discharge, no lesions              Cervix: no lesions              Pap taken: Yes.   Bimanual Exam:  Uterus:  normal size, contour, position, consistency, mobility, non-tender              Adnexa:  normal adnexa and no mass, fullness, tenderness               Rectovaginal: Confirms               Anus:  normal sphincter tone, no lesions  Chaperone was present for exam.  A:  Well Woman with normal exam PMP, no HRT H/O DDD in lumbar spine Atrohpic changes Husband with HPV related cancer.  Had recurrence but is doing quite well  P:   Mammogram guidelines reviewed pap smear only obtained Lab work and vaccinations are UTD pt pt return annually or prn

## 2017-08-26 ENCOUNTER — Other Ambulatory Visit (HOSPITAL_COMMUNITY)
Admission: RE | Admit: 2017-08-26 | Discharge: 2017-08-26 | Disposition: A | Discharge status: Home / Self Care | Payer: Medicare Other | Source: Ambulatory Visit | Attending: Obstetrics & Gynecology | Admitting: Obstetrics & Gynecology

## 2017-08-26 DIAGNOSIS — Z124 Encounter for screening for malignant neoplasm of cervix: Secondary | ICD-10-CM | POA: Insufficient documentation

## 2017-08-30 LAB — CYTOLOGY - PAP: Diagnosis: NEGATIVE

## 2017-09-29 ENCOUNTER — Other Ambulatory Visit: Payer: Self-pay

## 2017-09-29 ENCOUNTER — Encounter: Payer: Self-pay | Admitting: Emergency Medicine

## 2017-09-29 ENCOUNTER — Ambulatory Visit (INDEPENDENT_AMBULATORY_CARE_PROVIDER_SITE_OTHER): Payer: Medicare Other | Admitting: Emergency Medicine

## 2017-09-29 VITALS — BP 152/60 | HR 81 | Temp 99.1°F | Resp 16 | Ht 61.0 in | Wt 130.6 lb

## 2017-09-29 DIAGNOSIS — J029 Acute pharyngitis, unspecified: Secondary | ICD-10-CM

## 2017-09-29 LAB — POCT RAPID STREP A (OFFICE): RAPID STREP A SCREEN: NEGATIVE

## 2017-09-29 MED ORDER — AZITHROMYCIN 250 MG PO TABS: ORAL_TABLET | ORAL | 0 refills | Status: DC

## 2017-09-29 NOTE — Patient Instructions (Addendum)
Advised to start Zithromax if no better or worse in the next 24-48 hours.  Sore Throat When you have a sore throat, your throat may:  Hurt.  Burn.  Feel irritated.  Feel scratchy.  Many things can cause a sore throat, including:  An infection.  Allergies.  Dryness in the air.  Smoke or pollution.  Gastroesophageal reflux disease (GERD).  A tumor.  A sore throat can be the first sign of another sickness. It can happen with other problems, like coughing or a fever. Most sore throats go away without treatment. Follow these instructions at home:  Take over-the-counter medicines only as told by your doctor.  Drink enough fluids to keep your pee (urine) clear or pale yellow.  Rest when you feel you need to.  To help with pain, try: ? Sipping warm liquids, such as broth, herbal tea, or warm water. ? Eating or drinking cold or frozen liquids, such as frozen ice pops. ? Gargling with a salt-water mixture 3-4 times a day or as needed. To make a salt-water mixture, add -1 tsp of salt in 1 cup of warm water. Mix it until you cannot see the salt anymore. ? Sucking on hard candy or throat lozenges. ? Putting a cool-mist humidifier in your bedroom at night. ? Sitting in the bathroom with the door closed for 5-10 minutes while you run hot water in the shower.  Do not use any tobacco products, such as cigarettes, chewing tobacco, and e-cigarettes. If you need help quitting, ask your doctor. Contact a doctor if:  You have a fever for more than 2-3 days.  You keep having symptoms for more than 2-3 days.  Your throat does not get better in 7 days.  You have a fever and your symptoms suddenly get worse. Get help right away if:  You have trouble breathing.  You cannot swallow fluids, soft foods, or your saliva.  You have swelling in your throat or neck that gets worse.  You keep feeling like you are going to throw up (vomit).  You keep throwing up. This information is not  intended to replace advice given to you by your health care provider. Make sure you discuss any questions you have with your health care provider. Document Released: 07/07/2008 Document Revised: 05/24/2016 Document Reviewed: 07/19/2015 Elsevier Interactive Patient Education  2018 [employer].     IF you received an x-ray today, you will receive an invoice from [facility]. Please contact [facility] at [phone] with questions or concerns regarding your invoice.   IF you received labwork today, you will receive an invoice from [facility]. Please contact [facility] at [phone] with questions or concerns regarding your invoice.   Our billing staff will not be able to assist you with questions regarding bills from these companies.  You will be contacted with the lab results as soon as they are available. The fastest way to get your results is to activate your My Chart account. Instructions are located on the last page of this paperwork. If you have not heard from [place] regarding the results in 2 weeks, please contact this office.

## 2017-09-29 NOTE — Progress Notes (Signed)
Tammy Leonard 71 y.o.   Chief Complaint  Patient presents with  . Fever    low grade 100.5 degrees-ALL SXs 09/28/17  . Sore Throat    HISTORY OF PRESENT ILLNESS: This is a 71 y.o. female complaining of sore throat since yesterday.  Sore Throat   This is a new problem. The current episode started yesterday. The problem has been gradually worsening. The maximum temperature recorded prior to her arrival was 100.4 - 100.9 F. The fever has been present for less than 1 day. The pain is at a severity of 3/10. The pain is mild. Associated symptoms include coughing and ear pain. Pertinent negatives include no abdominal pain, congestion, diarrhea, headaches, neck pain, shortness of breath, swollen glands, trouble swallowing or vomiting.     Prior to Admission medications   Medication Sig Start Date End Date Taking? Authorizing Provider  ALPRAZolam Duanne Moron) 0.5 MG tablet Take 1 tablet (0.5 mg total) by mouth at bedtime as needed. 06/15/17  Yes Copland, Gay Filler, MD  amLODipine (NORVASC) 2.5 MG tablet TAKE 1 TABLET DAILY. 08/10/17  Yes Copland, Gay Filler, MD  ampicillin (PRINCIPEN) 500 MG capsule Take 500 mg by mouth daily. 5 x week   Yes [provider]  B Complex Vitamins (VITAMIN B COMPLEX PO) Take by mouth daily.   Yes [provider]  CALCIUM PO Take by mouth. + magnesium and vitamin d daily   Yes [provider]  hyoscyamine (LEVBID) 0.375 MG 12 hr tablet Take 0.375 mg daily by mouth. 12/18/16  Yes [provider]  Multiple Vitamins-Minerals (MULTIVITAMIN PO) Take by mouth daily.   Yes [provider]  Glucosamine HCl (GLUCOSAMINE PO) Take by mouth daily.    [provider]    Allergies  Allergen Reactions  . Compazine [Prochlorperazine Edisylate] Anxiety  . Lisinopril     Patient had a respiratory illness with cough and change made in her blood pressure medication. She did not have an allergic reaction or angioedema    Patient Active  Problem List   Diagnosis Date Noted  . History of diverticulitis 12/16/2016  . History of cryosurgery 08/28/2016  . Right hamstring muscle strain 08/22/2013  . Elevated blood pressure 08/22/2013    Past Medical History:  Diagnosis Date  . Abnormal Pap smear of cervix   . Anxiety   . Chlamydia    h/o  . Diverticulitis   . Hypertension   . Renal cyst    stable CT 5/11 [place] 10/12    Past Surgical History:  Procedure Laterality Date  . GYNECOLOGIC CRYOSURGERY      Social History   Socioeconomic History  . Marital status: Married    Spouse name: Not on file  . Number of children: Not on file  . Years of education: Not on file  . Highest education level: Not on file  Social Needs  . Financial resource strain: Not on file  . Food insecurity - worry: Not on file  . Food insecurity - inability: Not on file  . Transportation needs - medical: Not on file  . Transportation needs - non-medical: Not on file  Occupational History  . Not on file  Tobacco Use  . Smoking status: Never Smoker  . Smokeless tobacco: Never Used  Substance and Sexual Activity  . Alcohol use: Yes    Alcohol/week: 3.5 oz    Types: 7 Standard drinks or equivalent per week    Comment: glass of wine daily  . Drug use:  No  . Sexual activity: No    Partners: Male    Birth control/protection: Other-see comments    Comment: vasectomy  Other Topics Concern  . Not on file  Social History Narrative  . Not on file    Family History  Problem Relation Age of Onset  . Heart disease Mother   . Breast cancer Mother 61       and stomach cancer  . Mitral valve prolapse Mother   . Heart disease Father   . Prostate cancer Father   . Depression Daughter      Review of Systems  Constitutional: Positive for fever. Negative for chills.  HENT: Positive for ear pain and sore throat. Negative for congestion and trouble swallowing.   Eyes: Negative for discharge and redness.  Respiratory: Positive for cough.  Negative for shortness of breath.   Cardiovascular: Negative for chest pain and palpitations.  Gastrointestinal: Negative for abdominal pain, diarrhea, nausea and vomiting.  Genitourinary: Negative for dysuria and hematuria.  Musculoskeletal: Negative for neck pain.  Skin: Negative for rash.  Neurological: Negative for dizziness and headaches.  All other systems reviewed and are negative.     Vitals:   09/29/17 0829  BP: (!) 152/60  Pulse: 81  Resp: 16  Temp: 99.1 F (37.3 C)  SpO2: 98%    Physical Exam  Constitutional: She is oriented to person, place, and time. She appears well-developed and well-nourished.  HENT:  Head: Normocephalic and atraumatic.  Right Ear: Tympanic membrane and ear canal normal.  Left Ear: Tympanic membrane and ear canal normal.  Mouth/Throat: Uvula is midline and mucous membranes are normal. No uvula swelling. Posterior oropharyngeal erythema present. No oropharyngeal exudate, posterior oropharyngeal edema or tonsillar abscesses. No tonsillar exudate.  Eyes: EOM are normal. Pupils are equal, round, and reactive to light.  Neck: Normal range of motion. Neck supple.  Cardiovascular: Normal rate, regular rhythm and normal heart sounds.  Pulmonary/Chest: Effort normal and breath sounds normal.  Abdominal: Soft. There is no tenderness.  Lymphadenopathy:    She has no cervical adenopathy.  Neurological: She is alert and oriented to person, place, and time.  Skin: Skin is warm and dry. Capillary refill takes less than 2 seconds. No rash noted.  Psychiatric: She has a normal mood and affect. Her behavior is normal.   Results for orders placed or performed in visit on 09/29/17 (from the past 24 hour(s))  POCT rapid strep A     Status: None   Collection Time: 09/29/17  8:57 AM  Result Value Ref Range   Rapid Strep A Screen Negative Negative     ASSESSMENT & PLAN: Tammy Leonard was seen today for fever and sore throat.  Diagnoses and all orders for this  visit:  Sore throat -     POCT rapid strep A -     azithromycin (ZITHROMAX) 250 MG tablet; Sig as indicated  Acute pharyngitis, unspecified etiology    Patient Instructions   Advised to start Zithromax if no better or worse in the next 24-48 hours.  Sore Throat When you have a sore throat, your throat may:  Hurt.  Burn.  Feel irritated.  Feel scratchy.  Many things can cause a sore throat, including:  An infection.  Allergies.  Dryness in the air.  Smoke or pollution.  Gastroesophageal reflux disease (GERD).  A tumor.  A sore throat can be the first sign of another sickness. It can happen with other problems, like coughing or a fever. Most  sore throats go away without treatment. Follow these instructions at home:  Take over-the-counter medicines only as told by your doctor.  Drink enough fluids to keep your pee (urine) clear or pale yellow.  Rest when you feel you need to.  To help with pain, try: ? Sipping warm liquids, such as broth, herbal tea, or warm water. ? Eating or drinking cold or frozen liquids, such as frozen ice pops. ? Gargling with a salt-water mixture 3-4 times a day or as needed. To make a salt-water mixture, add -1 tsp of salt in 1 cup of warm water. Mix it until you cannot see the salt anymore. ? Sucking on hard candy or throat lozenges. ? Putting a cool-mist humidifier in your bedroom at night. ? Sitting in the bathroom with the door closed for 5-10 minutes while you run hot water in the shower.  Do not use any tobacco products, such as cigarettes, chewing tobacco, and e-cigarettes. If you need help quitting, ask your doctor. Contact a doctor if:  You have a fever for more than 2-3 days.  You keep having symptoms for more than 2-3 days.  Your throat does not get better in 7 days.  You have a fever and your symptoms suddenly get worse. Get help right away if:  You have trouble breathing.  You cannot swallow fluids, soft foods,  or your saliva.  You have swelling in your throat or neck that gets worse.  You keep feeling like you are going to throw up (vomit).  You keep throwing up. This information is not intended to replace advice given to you by your health care provider. Make sure you discuss any questions you have with your health care provider. Document Released: 07/07/2008 Document Revised: 05/24/2016 Document Reviewed: 07/19/2015 Elsevier Interactive Patient Education  2018 [employer].     IF you received an x-ray today, you will receive an invoice from [facility]. Please contact [facility] at [phone] with questions or concerns regarding your invoice.   IF you received labwork today, you will receive an invoice from [facility]. Please contact [facility] at [phone] with questions or concerns regarding your invoice.   Our billing staff will not be able to assist you with questions regarding bills from these companies.  You will be contacted with the lab results as soon as they are available. The fastest way to get your results is to activate your My Chart account. Instructions are located on the last page of this paperwork. If you have not heard from [place] regarding the results in 2 weeks, please contact this office.         Agustina Caroli, MD [facility]

## 2017-11-18 DIAGNOSIS — Z23 Encounter for immunization: Secondary | ICD-10-CM | POA: Diagnosis not present

## 2017-11-18 DIAGNOSIS — L570 Actinic keratosis: Secondary | ICD-10-CM | POA: Diagnosis not present

## 2017-12-04 ENCOUNTER — Other Ambulatory Visit: Payer: Self-pay | Admitting: Family Medicine

## 2017-12-04 DIAGNOSIS — I10 Essential (primary) hypertension: Secondary | ICD-10-CM

## 2017-12-10 DIAGNOSIS — C44629 Squamous cell carcinoma of skin of left upper limb, including shoulder: Secondary | ICD-10-CM

## 2017-12-10 HISTORY — DX: Squamous cell carcinoma of skin of left upper limb, including shoulder: C44.629

## 2017-12-23 DIAGNOSIS — H25813 Combined forms of age-related cataract, bilateral: Secondary | ICD-10-CM | POA: Diagnosis not present

## 2017-12-23 DIAGNOSIS — H5203 Hypermetropia, bilateral: Secondary | ICD-10-CM | POA: Diagnosis not present

## 2017-12-23 DIAGNOSIS — H40013 Open angle with borderline findings, low risk, bilateral: Secondary | ICD-10-CM | POA: Diagnosis not present

## 2017-12-30 DIAGNOSIS — D485 Neoplasm of uncertain behavior of skin: Secondary | ICD-10-CM | POA: Diagnosis not present

## 2017-12-30 DIAGNOSIS — C44629 Squamous cell carcinoma of skin of left upper limb, including shoulder: Secondary | ICD-10-CM | POA: Diagnosis not present

## 2018-01-02 NOTE — Progress Notes (Addendum)
[facility] [address] [place], [place] [address] [phone]  Date:  01/03/2018   Name:  Tammy Leonard   DOB:  04/25/1946   MRN:  [id number]  PCP:  Darreld Mclean, MD    Chief Complaint: Medication Follow up; Hypertension; and Anxiety   History of Present Illness:  Tammy Leonard is a 72 y.o. very pleasant female patient who presents with the following:  Following up today- history of HTN, anxiety Last seen here about one year ago:  Here for medicare wellness exam today with RN and also a recheck/ PE today. She is s/p pneumovax, prevnar, tdap and UTD on flu shot, also had zostavax in the past   mammo is UTD Colonoscopy 2011 dexa scan 2015 Hep c screening done  She recently saw her new grandchild- the family is doing well overall She is fasting today for labs  mammo tomorrow She still sees OBG for her paps, etc Her husband has recurrent metestatic cancer of the throat but he is doing very well with immunotherapy     BP Readings from Last 3 Encounters:  12/10/16 (!) 144/72  11/18/16 (!) 153/83  10/09/16 (!) 178/80   She is on amlodipine for her BP- 2.5 mg daily  She does monitor her BP at home and generally gets favorable numbers She did gain a few lbs recently on a trip to [place] to take care of their grandchild - she will work on losing this again  Her husband Vicente Serene is having his recurrent head and neck cancer treated at [facility] with immunotherapy every 3 weeks.  He also did radiation for his chest- 30 treatments- and this did clear up 2 nodes in his chest.  This is great news!   Flu:did not have, she may do next year Shingrix? Discussed with her, she will think about this Due for labs today- she ate some cereal and a sandwich today   Her last dexa was in 2015 - will order for her today She will also set up a mammogram  Pap last year per her GYN, [facility] Bogus BP Readings from Last 3  Encounters:  01/03/18 140/78  09/29/17 (!) 152/60  08/24/17 138/82   When she checks her home BP is is generally 130- 145/ 70s She is still taking 2.5 mg of amlodipine  Recheck BP ok today  She does take alprazolam on rare occasion- flying, waiting for her husband's test results .  Does not need a RF today  Exercises on a regular basis- golf, tennis  Patient Active Problem List   Diagnosis Date Noted  . History of diverticulitis 12/16/2016  . History of cryosurgery 08/28/2016  . Elevated blood pressure 08/22/2013    Past Medical History:  Diagnosis Date  . Abnormal Pap smear of cervix   . Anxiety   . Chlamydia    h/o  . Diverticulitis   . Hypertension   . Renal cyst    stable CT 5/11 [place] 10/12    Past Surgical History:  Procedure Laterality Date  . GYNECOLOGIC CRYOSURGERY      Social History   Tobacco Use  . Smoking status: Never Smoker  . Smokeless tobacco: Never Used  Substance Use Topics  . Alcohol use: Yes    Alcohol/week: 3.5 oz    Types: 7 Standard drinks or equivalent per week    Comment: glass of wine daily  . Drug use: No  Family History  Problem Relation Age of Onset  . Heart disease Mother   . Breast cancer Mother 87       and stomach cancer  . Mitral valve prolapse Mother   . Heart disease Father   . Prostate cancer Father   . Depression Daughter     Allergies  Allergen Reactions  . Compazine [Prochlorperazine Edisylate] Anxiety  . Lisinopril     Patient had a respiratory illness with cough and change made in her blood pressure medication. She did not have an allergic reaction or angioedema    Medication list has been reviewed and updated.  Current Outpatient Medications on File Prior to Visit  Medication Sig Dispense Refill  . ALPRAZolam (XANAX) 0.5 MG tablet Take 1 tablet (0.5 mg total) by mouth at bedtime as needed. 30 tablet 1  . ampicillin (PRINCIPEN) 500 MG capsule Take 500 mg by mouth daily. 5 x week    . B Complex Vitamins  (VITAMIN B COMPLEX PO) Take by mouth daily.    Marland Kitchen CALCIUM PO Take by mouth. + magnesium and vitamin d daily    . Glucosamine HCl (GLUCOSAMINE PO) Take by mouth daily.    . hyoscyamine (LEVBID) 0.375 MG 12 hr tablet Take 0.375 mg daily by mouth.    . Multiple Vitamins-Minerals (MULTIVITAMIN PO) Take by mouth daily.     No current facility-administered medications on file prior to visit.     Review of Systems:  As per HPI- otherwise negative. No CP or SOB No digestion issues No blood in stool  Physical Examination: Vitals:   01/03/18 1337 01/03/18 1401  BP: (!) 163/81 140/78  Pulse: 62   Resp: 16   SpO2: 99%    Vitals:   01/03/18 1337  Weight: 129 lb 12.8 oz (58.9 kg)  Height: 5\' 1"  (1.549 m)   Body mass index is 24.53 kg/m. Ideal Body Weight: Weight in (lb) to have BMI = 25: 132  GEN: WDWN, NAD, Non-toxic, A & O x 3, normal weight, looks well  HEENT: Atraumatic, Normocephalic. Neck supple. No masses, No LAD. Bilateral TM wnl, oropharynx normal.  PEERL,EOMI.   Ears and Nose: No external deformity. CV: RRR, No M/G/R. No JVD. No thrill. No extra heart sounds. PULM: CTA B, no wheezes, crackles, rhonchi. No retractions. No resp. distress. No accessory muscle use. ABD: S, NT, ND, +BS. No rebound. No HSM. EXTR: No c/c/e NEURO Normal gait.  PSYCH: Normally interactive. Conversant. Not depressed or anxious appearing.  Calm demeanor.  Appears quite fit and healthy for age  Assessment and Plan: Screening for osteoporosis - Plan: DG Bone Density  Estrogen deficiency - Plan: DG Bone Density  Screening for deficiency anemia - Plan: CBC  Screening for diabetes mellitus - Plan: Comprehensive metabolic panel  Screening for hyperlipidemia - Plan: Lipid panel  Elevated glucose - Plan: Hemoglobin A1c  Situational anxiety  Benign essential HTN  Essential hypertension, benign - Plan: amLODipine (NORVASC) 2.5 MG tablet following up today Doing well overall! Will plan further  follow- up pending labs. BP is under ok control, refilled her amlodipine Will plan further follow- up pending labs.   Signed Lamar Blinks, MD  Received her labs, message to pt  Blood count is normal metabolic profile looks good Cholesterol is very good A1c (average blood sugar) is in the pre-diabetes range, as before.  Stable from last year.  Continue to monitor  Take care!  We can plan to repeat labs in one year JC Results  for orders placed or performed in visit on 01/03/18  CBC  Result Value Ref Range   WBC 6.6 4.0 - 10.5 K/uL   RBC 4.27 3.87 - 5.11 Mil/uL   Platelets 292.0 150.0 - 400.0 K/uL   Hemoglobin 13.3 12.0 - 15.0 g/dL   HCT 39.3 36.0 - 46.0 %   MCV 91.9 78.0 - 100.0 fl   MCHC 33.9 30.0 - 36.0 g/dL   RDW 13.7 11.5 - 15.5 %  Comprehensive metabolic panel  Result Value Ref Range   Sodium 137 135 - 145 mEq/L   Potassium 3.8 3.5 - 5.1 mEq/L   Chloride 103 96 - 112 mEq/L   CO2 27 19 - 32 mEq/L   Glucose, Bld 111 (H) 70 - 99 mg/dL   BUN 23 6 - 23 mg/dL   Creatinine, Ser 0.96 0.40 - 1.20 mg/dL   Total Bilirubin 0.5 0.2 - 1.2 mg/dL   Alkaline Phosphatase 51 39 - 117 U/L   AST 30 0 - 37 U/L   ALT 22 0 - 35 U/L   Total Protein 7.1 6.0 - 8.3 g/dL   Albumin 4.2 3.5 - 5.2 g/dL   Calcium 9.8 8.4 - 10.5 mg/dL   GFR 61.09 >60.00 mL/min  Lipid panel  Result Value Ref Range   Cholesterol 184 0 - 200 mg/dL   Triglycerides 42.0 0.0 - 149.0 mg/dL   HDL 112.80 >39.00 mg/dL   VLDL 8.4 0.0 - 40.0 mg/dL   LDL Cholesterol 63 0 - 99 mg/dL   Total CHOL/HDL Ratio 2    NonHDL 71.37   Hemoglobin A1c  Result Value Ref Range   Hgb A1c MFr Bld 5.9 4.6 - 6.5 %

## 2018-01-03 ENCOUNTER — Encounter: Payer: Self-pay | Admitting: Family Medicine

## 2018-01-03 ENCOUNTER — Ambulatory Visit (INDEPENDENT_AMBULATORY_CARE_PROVIDER_SITE_OTHER): Payer: Medicare Other | Admitting: Family Medicine

## 2018-01-03 VITALS — BP 140/78 | HR 62 | Resp 16 | Ht 61.0 in | Wt 129.8 lb

## 2018-01-03 DIAGNOSIS — Z130 Encounter for screening for diseases of the blood and blood-forming organs and certain disorders involving the immune mechanism: Secondary | ICD-10-CM | POA: Diagnosis not present

## 2018-01-03 DIAGNOSIS — E2839 Other primary ovarian failure: Secondary | ICD-10-CM

## 2018-01-03 DIAGNOSIS — Z131 Encounter for screening for diabetes mellitus: Secondary | ICD-10-CM | POA: Diagnosis not present

## 2018-01-03 DIAGNOSIS — R7309 Other abnormal glucose: Secondary | ICD-10-CM | POA: Diagnosis not present

## 2018-01-03 DIAGNOSIS — Z13820 Encounter for screening for osteoporosis: Secondary | ICD-10-CM

## 2018-01-03 DIAGNOSIS — I10 Essential (primary) hypertension: Secondary | ICD-10-CM

## 2018-01-03 DIAGNOSIS — F418 Other specified anxiety disorders: Secondary | ICD-10-CM | POA: Diagnosis not present

## 2018-01-03 DIAGNOSIS — Z13220 Encounter for screening for lipoid disorders: Secondary | ICD-10-CM

## 2018-01-03 LAB — CBC
HCT: 39.3 % (ref 36.0–46.0)
Hemoglobin: 13.3 g/dL (ref 12.0–15.0)
MCHC: 33.9 g/dL (ref 30.0–36.0)
MCV: 91.9 fl (ref 78.0–100.0)
PLATELETS: 292 10*3/uL (ref 150.0–400.0)
RBC: 4.27 Mil/uL (ref 3.87–5.11)
RDW: 13.7 % (ref 11.5–15.5)
WBC: 6.6 10*3/uL (ref 4.0–10.5)

## 2018-01-03 LAB — COMPREHENSIVE METABOLIC PANEL
ALBUMIN: 4.2 g/dL (ref 3.5–5.2)
ALT: 22 U/L (ref 0–35)
AST: 30 U/L (ref 0–37)
Alkaline Phosphatase: 51 U/L (ref 39–117)
BILIRUBIN TOTAL: 0.5 mg/dL (ref 0.2–1.2)
BUN: 23 mg/dL (ref 6–23)
CHLORIDE: 103 meq/L (ref 96–112)
CO2: 27 mEq/L (ref 19–32)
CREATININE: 0.96 mg/dL (ref 0.40–1.20)
Calcium: 9.8 mg/dL (ref 8.4–10.5)
GFR: 61.09 mL/min (ref >60.00)
Glucose, Bld: 111 mg/dL — ABNORMAL HIGH (ref 70–99)
Potassium: 3.8 mEq/L (ref 3.5–5.1)
Sodium: 137 mEq/L (ref 135–145)
TOTAL PROTEIN: 7.1 g/dL (ref 6.0–8.3)

## 2018-01-03 LAB — LIPID PANEL
Cholesterol: 184 mg/dL (ref 0–200)
HDL: 112.8 mg/dL (ref >39.00)
LDL CALC: 63 mg/dL (ref 0–99)
NonHDL: 71.37
Total CHOL/HDL Ratio: 2
Triglycerides: 42 mg/dL (ref 0.0–149.0)
VLDL: 8.4 mg/dL (ref 0.0–40.0)

## 2018-01-03 LAB — HEMOGLOBIN A1C: HEMOGLOBIN A1C: 5.9 % (ref 4.6–6.5)

## 2018-01-03 MED ORDER — AMLODIPINE BESYLATE 2.5 MG PO TABS: 2.5000 mg | ORAL_TABLET | Freq: Every day | ORAL | 3 refills | Status: DC

## 2018-01-03 NOTE — Patient Instructions (Signed)
Great to see you today- take care and I will be in touch with your labs asap!   Will order a bone density for you today- if you like, call the breast center and schedule this and a mammogram at the same time  [phone]   You might consider getting shingrix series at your convenience

## 2018-01-05 ENCOUNTER — Encounter: Payer: Self-pay | Admitting: Family Medicine

## 2018-01-05 DIAGNOSIS — D049 Carcinoma in situ of skin, unspecified: Secondary | ICD-10-CM | POA: Insufficient documentation

## 2018-01-07 ENCOUNTER — Encounter: Payer: Self-pay | Admitting: Family Medicine

## 2018-01-07 ENCOUNTER — Other Ambulatory Visit: Payer: Self-pay | Admitting: Obstetrics & Gynecology

## 2018-01-07 ENCOUNTER — Other Ambulatory Visit: Payer: Self-pay | Admitting: Family Medicine

## 2018-01-07 DIAGNOSIS — Z1231 Encounter for screening mammogram for malignant neoplasm of breast: Secondary | ICD-10-CM

## 2018-01-07 DIAGNOSIS — E2839 Other primary ovarian failure: Secondary | ICD-10-CM

## 2018-01-12 DIAGNOSIS — L570 Actinic keratosis: Secondary | ICD-10-CM | POA: Diagnosis not present

## 2018-01-12 DIAGNOSIS — D2371 Other benign neoplasm of skin of right lower limb, including hip: Secondary | ICD-10-CM | POA: Diagnosis not present

## 2018-01-12 DIAGNOSIS — L821 Other seborrheic keratosis: Secondary | ICD-10-CM | POA: Diagnosis not present

## 2018-01-12 DIAGNOSIS — Z85828 Personal history of other malignant neoplasm of skin: Secondary | ICD-10-CM | POA: Diagnosis not present

## 2018-01-12 DIAGNOSIS — D235 Other benign neoplasm of skin of trunk: Secondary | ICD-10-CM | POA: Diagnosis not present

## 2018-01-24 ENCOUNTER — Encounter: Payer: Self-pay | Admitting: Family Medicine

## 2018-02-03 ENCOUNTER — Ambulatory Visit
Admission: RE | Admit: 2018-02-03 | Discharge: 2018-02-03 | Disposition: A | Discharge status: Home / Self Care | Payer: Medicare Other | Source: Ambulatory Visit | Attending: Family Medicine | Admitting: Family Medicine

## 2018-02-03 ENCOUNTER — Ambulatory Visit: Payer: Medicare Other

## 2018-02-03 ENCOUNTER — Ambulatory Visit
Admission: RE | Admit: 2018-02-03 | Discharge: 2018-02-03 | Disposition: A | Discharge status: Home / Self Care | Payer: Medicare Other | Source: Ambulatory Visit | Attending: Obstetrics & Gynecology | Admitting: Obstetrics & Gynecology

## 2018-02-03 DIAGNOSIS — M85852 Other specified disorders of bone density and structure, left thigh: Secondary | ICD-10-CM | POA: Diagnosis not present

## 2018-02-03 DIAGNOSIS — E2839 Other primary ovarian failure: Secondary | ICD-10-CM

## 2018-02-03 DIAGNOSIS — Z780 Asymptomatic menopausal state: Secondary | ICD-10-CM | POA: Diagnosis not present

## 2018-02-03 DIAGNOSIS — Z1231 Encounter for screening mammogram for malignant neoplasm of breast: Secondary | ICD-10-CM | POA: Diagnosis not present

## 2018-02-04 ENCOUNTER — Encounter: Payer: Self-pay | Admitting: Family Medicine

## 2018-02-04 ENCOUNTER — Encounter: Payer: Self-pay | Admitting: Obstetrics and Gynecology

## 2018-02-04 DIAGNOSIS — C44629 Squamous cell carcinoma of skin of left upper limb, including shoulder: Secondary | ICD-10-CM | POA: Diagnosis not present

## 2018-02-04 DIAGNOSIS — M8580 Other specified disorders of bone density and structure, unspecified site: Secondary | ICD-10-CM | POA: Insufficient documentation

## 2018-03-02 ENCOUNTER — Ambulatory Visit (INDEPENDENT_AMBULATORY_CARE_PROVIDER_SITE_OTHER): Payer: Medicare Other | Admitting: Family Medicine

## 2018-03-02 ENCOUNTER — Encounter: Payer: Self-pay | Admitting: Family Medicine

## 2018-03-02 ENCOUNTER — Ambulatory Visit: Payer: Medicare Other | Admitting: Family Medicine

## 2018-03-02 VITALS — BP 158/70 | HR 63 | Temp 97.6°F | Resp 16 | Ht 61.0 in | Wt 129.0 lb

## 2018-03-02 DIAGNOSIS — R030 Elevated blood-pressure reading, without diagnosis of hypertension: Secondary | ICD-10-CM

## 2018-03-02 DIAGNOSIS — R109 Unspecified abdominal pain: Secondary | ICD-10-CM | POA: Diagnosis not present

## 2018-03-02 LAB — POC URINALSYSI DIPSTICK (AUTOMATED)
BILIRUBIN UA: NEGATIVE
Blood, UA: NEGATIVE
Glucose, UA: NEGATIVE
KETONES UA: NEGATIVE
Leukocytes, UA: NEGATIVE
Nitrite, UA: NEGATIVE
Protein, UA: NEGATIVE
SPEC GRAV UA: 1.015 (ref 1.010–1.025)
Urobilinogen, UA: 0.2 E.U./dL
pH, UA: 6 (ref 5.0–8.0)

## 2018-03-02 NOTE — Patient Instructions (Signed)
Good to see you today- take care and please let me know if you have any recurrence of your symptoms.  If you have pain again that persists please seek care right away!    Please continue to monitor your BP at home- please check every day for a few days in both arms- and send me your results on mychart

## 2018-03-02 NOTE — Progress Notes (Addendum)
[facility] [address], [place], [place] [id number] [phone]  Date:  03/02/2018   Name:  Tammy Leonard   DOB:  1946/06/08   MRN:  [id number]  PCP:  Darreld Mclean, MD    Chief Complaint: Back Pain (possible kidney stone, burning with urination, this morning sharp pain in flank) and Hypertension (high blood pressure, 829 systolic when checked by EMS this morning)   History of Present Illness:  Tammy Leonard is a 72 y.o. very pleasant female patient who presents with the following:  Generally in good health, enjoys golf and tennis and is quite active  She awoke this am around 0630- all seemed well, but then she had a severe pain in her left flank. She tried to change position and then went to find her husband. She was in a lot of pain, tried to get dressed to to to the hospital but could not.  They had to call 911 as she was in so much pain. She found that she felt better if she leaned forward.  She was in terrible pain for about 5 minutes, then started to get better. She wanted to cancel the EMS call but it was too late!  EMS checked her out- thought that perhaps she had a kidney stone  She had a normal urine and stool this am No blood in either  Her BP however was quite high- SBP was up to 212, lower in the right than the left.  They checked it several times and noted that her BP gradually came down but she continued to have higher BP in the left c/w right  At this time she feels ok, pain is resolved  She has a history of diverticulitis but it generally does not present in this way Never had a kidney stone in the past   No fever, no CP or SOB  No belly pain She is able to eat normally   Her home BP is generally 130- 140/ 70s; admits that they don't check it that often  BP Readings from Last 3 Encounters:  03/02/18 (!) 158/70  01/03/18 140/78  09/29/17 (!) 152/60   She is a pt of Dr. Earlean Shawl for her  divercitultiis recnettly had a SCC removed from her left shoulder - this area is still a bit sore, but healing   Patient Active Problem List   Diagnosis Date Noted  . Osteopenia 02/04/2018  . Squamous cell carcinoma in situ (SCCIS) of skin 01/05/2018  . History of diverticulitis 12/16/2016  . History of cryosurgery 08/28/2016  . Elevated blood pressure 08/22/2013    Past Medical History:  Diagnosis Date  . Abnormal Pap smear of cervix   . Anxiety   . Chlamydia    h/o  . Diverticulitis   . Hypertension   . Osteopenia    hip and spine  . Renal cyst    stable CT 5/11 [place] 10/12    Past Surgical History:  Procedure Laterality Date  . GYNECOLOGIC CRYOSURGERY      Social History   Tobacco Use  . Smoking status: Never Smoker  . Smokeless tobacco: Never Used  Substance Use Topics  . Alcohol use: Yes    Alcohol/week: 3.5 oz    Types: 7 Standard drinks or equivalent per week    Comment: glass of wine daily  . Drug use: No    Family History  Problem Relation Age of Onset  .  Heart disease Mother   . Breast cancer Mother 49       and stomach cancer  . Mitral valve prolapse Mother   . Heart disease Father   . Prostate cancer Father   . Depression Daughter     Allergies  Allergen Reactions  . Compazine [Prochlorperazine Edisylate] Anxiety  . Lisinopril     Patient had a respiratory illness with cough and change made in her blood pressure medication. She did not have an allergic reaction or angioedema    Medication list has been reviewed and updated.  Current Outpatient Medications on File Prior to Visit  Medication Sig Dispense Refill  . ALPRAZolam (XANAX) 0.5 MG tablet Take 1 tablet (0.5 mg total) by mouth at bedtime as needed. 30 tablet 1  . amLODipine (NORVASC) 2.5 MG tablet Take 1 tablet (2.5 mg total) by mouth daily. 90 tablet 3  . ampicillin (PRINCIPEN) 500 MG capsule Take 500 mg by mouth daily. 5 x week    . B Complex Vitamins (VITAMIN B COMPLEX PO) Take by  mouth daily.    Marland Kitchen CALCIUM PO Take by mouth. + magnesium and vitamin d daily    . hyoscyamine (LEVBID) 0.375 MG 12 hr tablet Take 0.375 mg daily by mouth.    . Multiple Vitamins-Minerals (MULTIVITAMIN PO) Take by mouth daily.     No current facility-administered medications on file prior to visit.     Review of Systems:  As per HPI- otherwise negative.   Physical Examination: Vitals:   03/02/18 1406 03/02/18 1412  BP: (!) 160/72 (!) 158/70  Pulse: 63 63  Resp: 16   Temp: 97.6 F (36.4 C)   SpO2: 98%    Vitals:   03/02/18 1406  Weight: 129 lb (58.5 kg)  Height: 5\' 1"  (1.549 m)   Body mass index is 24.37 kg/m. Ideal Body Weight: Weight in (lb) to have BMI = 25: 132  GEN: WDWN, NAD, Non-toxic, A & O x 3, appears fit and healthy for age  24: Atraumatic, Normocephalic. Neck supple. No masses, No LAD.  Bilateral TM wnl, oropharynx normal.  PEERL,EOMI.   Ears and Nose: No external deformity. CV: RRR, No M/G/R. No JVD. No thrill. No extra heart sounds. PULM: CTA B, no wheezes, crackles, rhonchi. No retractions. No resp. distress. No accessory muscle use. ABD: S, NT, ND, +BS. No rebound. No HSM. No pulsatile mass or tenderness of abdomen EXTR: No c/c/e NEURO Normal gait.  PSYCH: Normally interactive. Conversant. Not depressed or anxious appearing.  Calm demeanor.   Results for orders placed or performed in visit on 03/02/18  POCT Urinalysis Dipstick (Automated)  Result Value Ref Range   Color, UA bright yellow    Clarity, UA clear    Glucose, UA Negative Negative   Bilirubin, UA negative    Ketones, UA negative    Spec Grav, UA 1.015 1.010 - 1.025   Blood, UA negative    pH, UA 6.0 5.0 - 8.0   Protein, UA Negative Negative   Urobilinogen, UA 0.2 0.2 or 1.0 E.U./dL   Nitrite, UA negative    Leukocytes, UA Negative Negative    Assessment and Plan: Flank pain - Plan: POCT Urinalysis Dipstick (Automated), Basic metabolic panel, CBC, Urine Culture  Elevated BP  without diagnosis of hypertension  Following up for an incident that happened earlier today She was noted to have left flank pain- severe for a few minutes and also high blood pressure which is now improved. Apparently her BP  was quite different in her 2 arms- higher on the left- but is now close to equal and slightly higher on the right  Discussed in detail with pt- suspect that she might have a renal stone.  Offered to do CT scan to look for this finding.  Also discussed remote possibility of aortic pathology.  However as pt is now feeling well and without pain this seems very unlikely.  Pt declined a CT at this time but will closely monitor her sx and will seek care right away if sx return Check basic labs as above as well She will monitor her BP a bit more closely and communicate with me on mychart about her results   Signed Lamar Blinks, MD   Received her labs 5/23- message to pt  Results for orders placed or performed in visit on 74/25/95  Basic metabolic panel  Result Value Ref Range   Sodium 140 135 - 145 mEq/L   Potassium 4.2 3.5 - 5.1 mEq/L   Chloride 103 96 - 112 mEq/L   CO2 29 19 - 32 mEq/L   Glucose, Bld 101 (H) 70 - 99 mg/dL   BUN 25 (H) 6 - 23 mg/dL   Creatinine, Ser 0.96 0.40 - 1.20 mg/dL   Calcium 10.0 8.4 - 10.5 mg/dL   GFR 61.06 >60.00 mL/min  CBC  Result Value Ref Range   WBC 6.6 4.0 - 10.5 K/uL   RBC 4.42 3.87 - 5.11 Mil/uL   Platelets 278.0 150.0 - 400.0 K/uL   Hemoglobin 13.8 12.0 - 15.0 g/dL   HCT 41.4 36.0 - 46.0 %   MCV 93.6 78.0 - 100.0 fl   MCHC 33.4 30.0 - 36.0 g/dL   RDW 13.5 11.5 - 15.5 %  POCT Urinalysis Dipstick (Automated)  Result Value Ref Range   Color, UA bright yellow    Clarity, UA clear    Glucose, UA Negative Negative   Bilirubin, UA negative    Ketones, UA negative    Spec Grav, UA 1.015 1.010 - 1.025   Blood, UA negative    pH, UA 6.0 5.0 - 8.0   Protein, UA Negative Negative   Urobilinogen, UA 0.2 0.2 or 1.0 E.U./dL    Nitrite, UA negative    Leukocytes, UA Negative Negative

## 2018-03-03 ENCOUNTER — Encounter: Payer: Self-pay | Admitting: Family Medicine

## 2018-03-03 LAB — BASIC METABOLIC PANEL
BUN: 25 mg/dL — AB (ref 6–23)
CHLORIDE: 103 meq/L (ref 96–112)
CO2: 29 meq/L (ref 19–32)
Calcium: 10 mg/dL (ref 8.4–10.5)
Creatinine, Ser: 0.96 mg/dL (ref 0.40–1.20)
GFR: 61.06 mL/min (ref >60.00)
GLUCOSE: 101 mg/dL — AB (ref 70–99)
Potassium: 4.2 mEq/L (ref 3.5–5.1)
Sodium: 140 mEq/L (ref 135–145)

## 2018-03-03 LAB — URINE CULTURE
MICRO NUMBER:: 90622450
Result:: NO GROWTH
SPECIMEN QUALITY:: ADEQUATE

## 2018-03-03 LAB — CBC
HEMATOCRIT: 41.4 % (ref 36.0–46.0)
Hemoglobin: 13.8 g/dL (ref 12.0–15.0)
MCHC: 33.4 g/dL (ref 30.0–36.0)
MCV: 93.6 fl (ref 78.0–100.0)
Platelets: 278 10*3/uL (ref 150.0–400.0)
RBC: 4.42 Mil/uL (ref 3.87–5.11)
RDW: 13.5 % (ref 11.5–15.5)
WBC: 6.6 10*3/uL (ref 4.0–10.5)

## 2018-04-21 ENCOUNTER — Encounter: Payer: Self-pay | Admitting: Family Medicine

## 2018-04-21 DIAGNOSIS — F419 Anxiety disorder, unspecified: Secondary | ICD-10-CM

## 2018-04-21 MED ORDER — ALPRAZOLAM 0.5 MG PO TABS: 0.5000 mg | ORAL_TABLET | Freq: Every evening | ORAL | 1 refills | Status: DC | PRN

## 2018-04-21 NOTE — Telephone Encounter (Signed)
[facility] won't allow me to log in at this time  BP Readings from Last 3 Encounters:  03/02/18 (!) 158/70  01/03/18 140/78  09/29/17 (!) 152/60   Will hold her BP meds at current dosage for now

## 2018-04-21 NOTE — Telephone Encounter (Signed)
Requesting:Alprazolam Contract:none, needs csc OFB:PZWC, needs uds Last Visit:03/02/18 Next Visit:none Last Refill:06/15/17 1 refill  Please Advise

## 2018-05-23 DIAGNOSIS — Z8719 Personal history of other diseases of the digestive system: Secondary | ICD-10-CM | POA: Diagnosis not present

## 2018-05-23 DIAGNOSIS — K5790 Diverticulosis of intestine, part unspecified, without perforation or abscess without bleeding: Secondary | ICD-10-CM | POA: Diagnosis not present

## 2018-07-21 DIAGNOSIS — H25813 Combined forms of age-related cataract, bilateral: Secondary | ICD-10-CM | POA: Diagnosis not present

## 2018-07-21 DIAGNOSIS — H40013 Open angle with borderline findings, low risk, bilateral: Secondary | ICD-10-CM | POA: Diagnosis not present

## 2018-07-30 NOTE — Progress Notes (Addendum)
[facility] at [facility] [address], [place], [place] [address] [phone]  Date:  08/01/2018   Name:  Tammy Leonard   DOB:  11/30/1945   MRN:  [id number]  PCP:  Darreld Mclean, MD    Chief Complaint: Back Pain (radiating down leg, worse in left side) and Leg Pain (6 months, bilateral leg pain worse in left, hamstring, playing tennis and golf-possibly related? )   History of Present Illness:  Tammy Leonard is a 72 y.o. very pleasant female patient who presents with the following:  Following up today with concern of hamstring pain bilaterally History of HTN, diverticulitis and skin cancer, mild HTN Labs are UTD Flu shot is due- give today   Her husband is here today to help with history She notes sx in both her hamstrings  She is very active in tennis and golf.  She is playing a sport about 5x a week. She has noted hamstring issues since about April when her sports season picked up She is fine when she is asleep When she gets out of bed within minutes she will start to have severe pain in her posterior thighs. Worse in the left.  She will sit and rest for about 5 minutes and will feel better, when she gets up again she feels better somewhat. Leaning forward helps too. However crouch/ squat is worse She was not able to complete her golf game last week which is really unusual for her   advil was helping at first.  However the last couple of weeks  She does not notice any back pain  The pain stops at the knee, does not go into the calves at all  Stretching, foam roller not helping  No numbness noted Not really weak but the pain will make it feel weak sometimes Better if she rests for a few days like she did this past weekend.  However she rarely gives herself any of this year No bowel or bladder control change   BP Readings from Last 3 Encounters:  08/01/18 140/70  03/02/18 (!) 158/70  01/03/18 140/78     Xanax-  rarely  Amlodipine levbid  Patient Active Problem List   Diagnosis Date Noted  . Osteopenia 02/04/2018  . Squamous cell carcinoma in situ (SCCIS) of skin 01/05/2018  . History of diverticulitis 12/16/2016  . History of cryosurgery 08/28/2016  . Elevated blood pressure 08/22/2013    Past Medical History:  Diagnosis Date  . Abnormal Pap smear of cervix   . Anxiety   . Chlamydia    h/o  . Diverticulitis   . Hypertension   . Osteopenia    hip and spine  . Renal cyst    stable CT 5/11 [place] 10/12    Past Surgical History:  Procedure Laterality Date  . GYNECOLOGIC CRYOSURGERY      Social History   Tobacco Use  . Smoking status: Never Smoker  . Smokeless tobacco: Never Used  Substance Use Topics  . Alcohol use: Yes    Alcohol/week: 7.0 standard drinks    Types: 7 Standard drinks or equivalent per week    Comment: glass of wine daily  . Drug use: No    Family History  Problem Relation Age of Onset  . Heart disease Mother   . Breast cancer Mother 76       and stomach cancer  . Mitral valve prolapse Mother   . Heart disease  Father   . Prostate cancer Father   . Depression Daughter     Allergies  Allergen Reactions  . Compazine [Prochlorperazine Edisylate] Anxiety  . Lisinopril     Patient had a respiratory illness with cough and change made in her blood pressure medication. She did not have an allergic reaction or angioedema    Medication list has been reviewed and updated.  Current Outpatient Medications on File Prior to Visit  Medication Sig Dispense Refill  . ALPRAZolam (XANAX) 0.5 MG tablet Take 1 tablet (0.5 mg total) by mouth at bedtime as needed. 30 tablet 1  . amLODipine (NORVASC) 2.5 MG tablet Take 1 tablet (2.5 mg total) by mouth daily. 90 tablet 3  . ampicillin (PRINCIPEN) 500 MG capsule Take 500 mg by mouth daily. 5 x week    . B Complex Vitamins (VITAMIN B COMPLEX PO) Take by mouth daily.    Marland Kitchen CALCIUM PO Take by mouth. + magnesium and vitamin d  daily    . Multiple Vitamins-Minerals (MULTIVITAMIN PO) Take by mouth daily.    . hyoscyamine (LEVBID) 0.375 MG 12 hr tablet Take 0.375 mg daily by mouth.     No current facility-administered medications on file prior to visit.     Review of Systems:  As per HPI- otherwise negative.   Physical Examination: Vitals:   08/01/18 0843  BP: 140/70  Pulse: 64  Resp: 16  Temp: 98 F (36.7 C)  SpO2: 97%   Vitals:   08/01/18 0843  Weight: 129 lb (58.5 kg)  Height: 5\' 1"  (1.549 m)   Body mass index is 24.37 kg/m. Ideal Body Weight: Weight in (lb) to have BMI = 25: 132  GEN: WDWN, NAD, Non-toxic, A & O x 3, looks well, normal weight  HEENT: Atraumatic, Normocephalic. Neck supple. No masses, No LAD. Ears and Nose: No external deformity. CV: RRR, No M/G/R. No JVD. No thrill. No extra heart sounds. PULM: CTA B, no wheezes, crackles, rhonchi. No retractions. No resp. distress. No accessory muscle use EXTR: No c/c/e NEURO Normal gait.  PSYCH: Normally interactive. Conversant. Not depressed or anxious appearing.  Calm demeanor.  Normal strength of both hamstrings today Normal BLE strength, sensation and DTR Minimal tenderness at proximal hamstring tendon insertion Normal TL spine ROM, normal toe raise, negative SLR bilaterally     Assessment and Plan: Benign essential HTN  Hamstring injury, unspecified laterality, initial encounter - Plan: DG Lumbar Spine Complete, cyclobenzaprine (FLEXERIL) 10 MG tablet  Needs flu shot  Following up today BP acceptable on minimal dose of amlodipine. Continue to monitor Flu shot given today Bilateral hamstring pain for 6 months approx. ?muscular vs a spine issue.  Obtain plain spine films today She plans to see Andreas Blower for a hamstring exam as well Will have her try flexeril at bedtime as she tends to have severe sx upon awakening  Will plan further follow- up pending x-ray report   Signed Lamar Blinks, MD   Received x-ray  reports, message to pt  Dg Lumbar Spine Complete  Result Date: 08/01/2018 CLINICAL DATA:  Back pain and hamstring pain, initial encounter EXAM: LUMBAR SPINE - COMPLETE 4+ VIEW COMPARISON:  None. FINDINGS: Five lumbar type vertebral bodies are well visualized. Vertebral body height is well maintained. Mild degenerative anterolisthesis of L3 on L4 and L4 on L5 is seen. No pars defects are noted. No soft tissue abnormality is seen. IMPRESSION: Degenerative changes with mild anterolisthesis of L3 on L4 and L4 on L5 as  described. Electronically Signed   By: Inez Catalina M.D.   On: 08/01/2018 10:39

## 2018-08-01 ENCOUNTER — Ambulatory Visit: Payer: Self-pay | Admitting: *Deleted

## 2018-08-01 ENCOUNTER — Encounter: Payer: Self-pay | Admitting: Family Medicine

## 2018-08-01 ENCOUNTER — Ambulatory Visit (HOSPITAL_BASED_OUTPATIENT_CLINIC_OR_DEPARTMENT_OTHER)
Admission: RE | Admit: 2018-08-01 | Discharge: 2018-08-01 | Disposition: A | Discharge status: Home / Self Care | Payer: Medicare Other | Source: Ambulatory Visit | Attending: Family Medicine | Admitting: Family Medicine

## 2018-08-01 ENCOUNTER — Ambulatory Visit (INDEPENDENT_AMBULATORY_CARE_PROVIDER_SITE_OTHER): Payer: Medicare Other | Admitting: Family Medicine

## 2018-08-01 VITALS — BP 140/70 | HR 64 | Temp 98.0°F | Resp 16 | Ht 61.0 in | Wt 129.0 lb

## 2018-08-01 DIAGNOSIS — Z23 Encounter for immunization: Secondary | ICD-10-CM | POA: Diagnosis not present

## 2018-08-01 DIAGNOSIS — I10 Essential (primary) hypertension: Secondary | ICD-10-CM | POA: Diagnosis not present

## 2018-08-01 DIAGNOSIS — M47896 Other spondylosis, lumbar region: Secondary | ICD-10-CM | POA: Diagnosis not present

## 2018-08-01 DIAGNOSIS — S76309A Unspecified injury of muscle, fascia and tendon of the posterior muscle group at thigh level, unspecified thigh, initial encounter: Secondary | ICD-10-CM | POA: Diagnosis not present

## 2018-08-01 DIAGNOSIS — M4316 Spondylolisthesis, lumbar region: Secondary | ICD-10-CM | POA: Diagnosis not present

## 2018-08-01 DIAGNOSIS — M545 Low back pain: Secondary | ICD-10-CM | POA: Diagnosis not present

## 2018-08-01 MED ORDER — CYCLOBENZAPRINE HCL 10 MG PO TABS: ORAL_TABLET | ORAL | 0 refills | Status: DC

## 2018-08-01 NOTE — Telephone Encounter (Signed)
Patient received the high dose flu vaccine this morning and now has body aches and low grade fever of 100.0. She also has a sore throat at this time. Reviewed with patient's husband body aches and low grade fever are normal reactions for some people after receiving the flu vaccine and should subside after 24-48 hours. Regarding her sore throat-if the sore throat continues and her temperature goes higher and she begins to have difficulty swallowing, call back. Recommended tylenol or advil today and tomorrow if needed,to help ease her symptoms. Suggested warm liquids and throat lozenges to ease sore throat. She received the high dose flu vaccine today.  Reason for Disposition . Caller has medication question, adult has minor symptoms, caller declines triage, and triager answers question  Answer Assessment - Initial Assessment Questions 1. SYMPTOMS: "Do you have any symptoms?"     Body aches and low grade fever of 100.0  2. SEVERITY: If symptoms are present, ask "Are they mild, moderate or severe?"  Protocols used: MEDICATION QUESTION CALL-A-AH

## 2018-08-01 NOTE — Patient Instructions (Addendum)
We will get films of your lower back today - I will let you know how these look If you appear to have a lot of arthritis we may want to do an MRI Please call Dr. Oneida Alar to take a look at your hamstrings- let me know if any difficulty here  Let's try a muscle relaxer at night for a couple of weeks.  You can take in the daytime as well but be cautious of sedation - do not combine with xanax or alcohol   Flu shot today

## 2018-08-04 ENCOUNTER — Encounter: Payer: Self-pay | Admitting: Sports Medicine

## 2018-08-04 ENCOUNTER — Ambulatory Visit (INDEPENDENT_AMBULATORY_CARE_PROVIDER_SITE_OTHER): Payer: Medicare Other | Admitting: Sports Medicine

## 2018-08-04 VITALS — BP 152/82 | Ht 61.0 in | Wt 125.0 lb

## 2018-08-04 DIAGNOSIS — M48061 Spinal stenosis, lumbar region without neurogenic claudication: Secondary | ICD-10-CM | POA: Diagnosis not present

## 2018-08-04 DIAGNOSIS — M4317 Spondylolisthesis, lumbosacral region: Secondary | ICD-10-CM

## 2018-08-04 DIAGNOSIS — M48062 Spinal stenosis, lumbar region with neurogenic claudication: Secondary | ICD-10-CM

## 2018-08-04 NOTE — Patient Instructions (Addendum)
Hamstring pain is caused by spinal stenosis that is causing your nerve roots to pull and cause pain. Your hamstrings are weak on exam.  Do yoga and pilates-- avoid arching/extending your back  Gold causes worsening as you arch your back during your swing  Strengthen core with crunches Crunches: knees at 90 degrees   With knees apart x 15-20   With knees together x 15-20   With legs extended x 15-20   With reverse sit ups x 15-20 Pelvic tilt: lie on back- belly button to spine  - 5x   Take a break from tennis and golf for about a month until pain gets better

## 2018-08-04 NOTE — Assessment & Plan Note (Signed)
This is likely the  Source of her hamstring Strain No findings on exam of hamstring except weakness  Core rehab Hamstring strength work Avoid back extension

## 2018-08-04 NOTE — Progress Notes (Signed)
PCP: Copland, Gay Filler, MD  Subjective:   HPI: Patient is a 72 y.o. female who presents for evaluation of bilateral hamstring pain.  She reports that she has had worsening bilateral hamstring pain for the past 4-6 months. She describes the pain as radiating from back of buttocks down to back of knee. She plays tennis and golf 5 times a week. She has been taking increasing amounts of ibuprofen in order to play and this past week she played through pain. She had to stop during her golf game a few days ago. She reports that the pain is worse in the morning when she first gets out of bed. She has to bend over to relieve the pain and then sit for 5 minutes before continuing with her day. No bruising, swelling. She sometimes has tingling after she first sits in the morning. She has tried stretching, foam rolling with minimal relief. No numbness. No pain in buttocks or calf.  She has a history of L hamstring strain in 2014.   Past Medical History:  Diagnosis Date  . Abnormal Pap smear of cervix   . Anxiety   . Chlamydia    h/o  . Diverticulitis   . Hypertension   . Osteopenia    hip and spine  . Renal cyst    stable CT 5/11 [place] 10/12    Current Outpatient Medications on File Prior to Visit  Medication Sig Dispense Refill  . ALPRAZolam (XANAX) 0.5 MG tablet Take 1 tablet (0.5 mg total) by mouth at bedtime as needed. 30 tablet 1  . amLODipine (NORVASC) 2.5 MG tablet Take 1 tablet (2.5 mg total) by mouth daily. 90 tablet 3  . ampicillin (PRINCIPEN) 500 MG capsule Take 500 mg by mouth daily. 5 x week    . B Complex Vitamins (VITAMIN B COMPLEX PO) Take by mouth daily.    Marland Kitchen CALCIUM PO Take by mouth. + magnesium and vitamin d daily    . cyclobenzaprine (FLEXERIL) 10 MG tablet Take 1/2 or 1 BID prn leg pain.  Caution regarding sedation 30 tablet 0  . Multiple Vitamins-Minerals (MULTIVITAMIN PO) Take by mouth daily.    . hyoscyamine (LEVBID) 0.375 MG 12 hr tablet Take 0.375 mg daily by mouth.      No current facility-administered medications on file prior to visit.     Past Surgical History:  Procedure Laterality Date  . GYNECOLOGIC CRYOSURGERY      Allergies  Allergen Reactions  . Compazine [Prochlorperazine Edisylate] Anxiety  . Lisinopril     Patient had a respiratory illness with cough and change made in her blood pressure medication. She did not have an allergic reaction or angioedema    Social History   Socioeconomic History  . Marital status: Married    Spouse name: Not on file  . Number of children: Not on file  . Years of education: Not on file  . Highest education level: Not on file  Occupational History  . Not on file  Social Needs  . Financial resource strain: Not on file  . Food insecurity:    Worry: Not on file    Inability: Not on file  . Transportation needs:    Medical: Not on file    Non-medical: Not on file  Tobacco Use  . Smoking status: Never Smoker  . Smokeless tobacco: Never Used  Substance and Sexual Activity  . Alcohol use: Yes    Alcohol/week: 7.0 standard drinks    Types: 7 Standard drinks  or equivalent per week    Comment: glass of wine daily  . Drug use: No  . Sexual activity: Never    Partners: Male    Birth control/protection: Other-see comments    Comment: vasectomy  Lifestyle  . Physical activity:    Days per week: Not on file    Minutes per session: Not on file  . Stress: Not on file  Relationships  . Social connections:    Talks on phone: Not on file    Gets together: Not on file    Attends religious service: Not on file    Active member of club or organization: Not on file    Attends meetings of clubs or organizations: Not on file    Relationship status: Not on file  . Intimate partner violence:    Fear of current or ex partner: Not on file    Emotionally abused: Not on file    Physically abused: Not on file    Forced sexual activity: Not on file  Other Topics Concern  . Not on file  Social History  Narrative  . Not on file    Family History  Problem Relation Age of Onset  . Heart disease Mother   . Breast cancer Mother 55       and stomach cancer  . Mitral valve prolapse Mother   . Heart disease Father   . Prostate cancer Father   . Depression Daughter     BP (!) 152/82   Ht 5\' 1"  (1.549 m)   Wt 125 lb (56.7 kg)   LMP 10/13/1999   BMI 23.62 kg/m   Review of Systems: See HPI above.     Objective:  Physical Exam:  Gen: NAD, comfortable in exam room BP (!) 152/82   Ht 5\' 1"  (1.549 m)   Wt 125 lb (56.7 kg)   LMP 10/13/1999   BMI 23.62 kg/m    Right LE: no apparent swelling or erythema over hamstring, she has full range of motion at the knee, no TTP over hamsting, at the origin and insertion sites of the hamstring 3/5 strength on testing all 3 heads of the hamstring, 3/5 strength with isolation of the biceps femoris, she is neurovascularly intact Hamstring flexibility beyond 90 degrees  Left LE: no apparent swelling or erythema over hamstring, she has full range of motion at the knee, she has no tenderness to palpation over her hamstring, she is non-tender at the origin and insertion sites of the hamstring, 3/5 strength on testing all 3 heads of the hamstring, 3/5 strength with isolation of the biceps femoris, she is neurovascularly intact, H test with ROM past 90 degrees   Note Hamstring strength is strong at 90 deg/ weak at 20 deg (Eccentric) knee flexion  Back: Notable step-off between L5 and S1. No pain with back extension, back flexion, or back extension with leg lifted.  Reviewed plain films of spine- showed mild degenerative anterolisthesis of L3 on L4 and L4 on L5 as well as spondylolisthesis, bone spurs on L5 and S1   Assessment & Plan:  1. Hamstring pain and weakness- likely neurogenic pain and associated weakness from spinal stenosis and spondylolisthesis of L5-S1 seen on plain films that were independently reviewed today - recommended core  strengthening exercises, avoiding back extension, resting from golf and tennis x 1 month  - return if pain does not improve   Sherilyn Banker, MD [facility], PGY-3  I observed and examined the patient with the resident  and agree with assessment and plan.  Note reviewed and modified by me. Stefanie Libel, MD

## 2018-08-23 ENCOUNTER — Ambulatory Visit: Payer: Medicare Other | Admitting: Sports Medicine

## 2018-08-23 ENCOUNTER — Encounter

## 2018-08-31 ENCOUNTER — Other Ambulatory Visit: Payer: Self-pay

## 2018-11-15 ENCOUNTER — Encounter

## 2018-11-15 ENCOUNTER — Encounter: Payer: Self-pay | Admitting: Obstetrics & Gynecology

## 2018-11-15 ENCOUNTER — Ambulatory Visit (INDEPENDENT_AMBULATORY_CARE_PROVIDER_SITE_OTHER): Payer: Medicare Other | Admitting: Obstetrics & Gynecology

## 2018-11-15 VITALS — BP 110/60 | HR 72 | Resp 16 | Ht 60.5 in | Wt 131.6 lb

## 2018-11-15 DIAGNOSIS — Z124 Encounter for screening for malignant neoplasm of cervix: Secondary | ICD-10-CM

## 2018-11-15 DIAGNOSIS — Z01419 Encounter for gynecological examination (general) (routine) without abnormal findings: Secondary | ICD-10-CM

## 2018-11-15 NOTE — Progress Notes (Signed)
73 y.o. G2P2 Married White or Caucasian female here for annual exam.  Doing well.  Husband is doing well.  He is on immunotherapy.  He did have chest wall radiation on two positive lymph nodes that were in his neck.    Denies vaginal bleeding.  Not SA due to husband's disease.     Patient's last menstrual period was 10/13/1999.          Sexually active: Yes.    The current method of family planning is post menopausal status.    Exercising: No.  Tennis Golf yoga  Smoker:  no  Health Maintenance: Pap: 08/26/17 normal  08/28/16 Neg              08/30/15 Neg. HR HPV:neg  History of abnormal Pap:  yes MMG: 02/03/18 Density C Bi-rads 1 Neg Colonoscopy:  2018 1 Polyp. F/u 5 years  BMD 11/18/16 -1.1 left femoral neck TDaP:  08/2015 Pneumonia vaccine(s):  2017 Shingrix:   Zostavax 2012  Hep C testing: 10/30/15 neg Screening Labs: PCP   reports that she has never smoked. She has never used smokeless tobacco. She reports current alcohol use of about 7.0 standard drinks of alcohol per week. She reports that she does not use drugs.  Past Medical History:  Diagnosis Date  . Abnormal Pap smear of cervix   . Anxiety   . Chlamydia    h/o  . Diverticulitis   . Hypertension   . Osteopenia    hip and spine  . Renal cyst    stable CT 5/11 [place] 10/12    Past Surgical History:  Procedure Laterality Date  . GYNECOLOGIC CRYOSURGERY      Current Outpatient Medications  Medication Sig Dispense Refill  . ALPRAZolam (XANAX) 0.5 MG tablet Take 1 tablet (0.5 mg total) by mouth at bedtime as needed. 30 tablet 1  . amLODipine (NORVASC) 2.5 MG tablet Take 1 tablet (2.5 mg total) by mouth daily. 90 tablet 3  . ampicillin (PRINCIPEN) 500 MG capsule Take 500 mg by mouth daily. 5 x week    . B Complex Vitamins (VITAMIN B COMPLEX PO) Take by mouth daily.    Marland Kitchen CALCIUM PO Take by mouth. + magnesium and vitamin d daily    . cyclobenzaprine (FLEXERIL) 10 MG tablet Take 1/2 or 1 BID prn leg pain.  Caution regarding  sedation 30 tablet 0  . hyoscyamine (LEVBID) 0.375 MG 12 hr tablet Take 0.375 mg daily by mouth.    . Multiple Vitamins-Minerals (MULTIVITAMIN PO) Take by mouth daily.     No current facility-administered medications for this visit.     Family History  Problem Relation Age of Onset  . Heart disease Mother   . Breast cancer Mother 69       and stomach cancer  . Mitral valve prolapse Mother   . Heart disease Father   . Prostate cancer Father   . Depression Daughter     Review of Systems  All other systems reviewed and are negative.   Exam:   BP 110/60   Pulse 72   Resp 16   Ht 5' 0.5" (1.537 m)   Wt 131 lb 9.6 oz (59.7 kg)   LMP 10/13/1999   BMI 25.28 kg/m   Height: 5' 0.5" (153.7 cm)  Ht Readings from Last 3 Encounters:  11/15/18 5' 0.5" (1.537 m)  08/04/18 5\' 1"  (1.549 m)  08/01/18 5\' 1"  (1.549 m)    General appearance: alert, cooperative and appears stated  age Head: Normocephalic, without obvious abnormality, atraumatic Neck: no adenopathy, supple, symmetrical, trachea midline and thyroid normal to inspection and palpation Lungs: clear to auscultation bilaterally Breasts: normal appearance, no masses or tenderness Heart: regular rate and rhythm Abdomen: soft, non-tender; bowel sounds normal; no masses,  no organomegaly Extremities: extremities normal, atraumatic, no cyanosis or edema Skin: Skin color, texture, turgor normal. No rashes or lesions Lymph nodes: Cervical, supraclavicular, and axillary nodes normal. No abnormal inguinal nodes palpated Neurologic: Grossly normal   Pelvic: External genitalia:  no lesions              Urethra:  normal appearing urethra with no masses, tenderness or lesions              Bartholins and Skenes: normal                 Vagina: normal appearing vagina with normal color and discharge, no lesions              Cervix: no lesions              Pap taken: No. Bimanual Exam:  Uterus:  normal size, contour, position, consistency,  mobility, non-tender              Adnexa: normal adnexa and no mass, fullness, tenderness               Rectovaginal: Confirms               Anus:  normal sphincter tone, no lesions  Chaperone was present for exam.  A:  Well Woman with normal exam PMP, no HRT H/O spinal stenosis in lumber region Vaginal atrophic changes Husband with HPV related cancer  P:   Mammogram guidelines reviewed.  Doing yearly. pap smear not obtained today.  She and I had discussed doing yearly screening but she is comfortable with not having a pap smear this year Lab work UTD.  Vaccines UTD except shingrix.   Return annually or prn

## 2018-12-06 ENCOUNTER — Ambulatory Visit (INDEPENDENT_AMBULATORY_CARE_PROVIDER_SITE_OTHER): Payer: Medicare Other | Admitting: Sports Medicine

## 2018-12-06 VITALS — BP 152/74 | Ht 61.0 in | Wt 127.0 lb

## 2018-12-06 DIAGNOSIS — M5416 Radiculopathy, lumbar region: Secondary | ICD-10-CM

## 2018-12-06 NOTE — Progress Notes (Addendum)
   HPI  CC: Low back pain  Tammy Leonard is a 73 year old female who presents for low back pain.  She was previously seen on 08/04/2018.  At that visit she was given some home exercises to work on.  It was recommended she stop golf and tennis x1 month.  She was also recommend to stop doing back extension exercises.  She states her pain greatly improved over that time.  She states the pain returned around 1 to 2 weeks ago.  She states that she noticed that when she was playing golf.  She is only able play 7 holes before her back pain started up again.  She states that it comes on around 3 times a day now.  She takes 2 Advil before she plays tennis or golf.  She states is helped somewhat.  She states that the pain is worse in the morning.  She states is worse when she lifts overhead objects.  She states is worse when she does a chair pose during yoga.  She states this made better when she sits down and rest.  She states leaning forward helps somewhat.  She also states that yoga generally helps.  She denies any numbness or tingling down her leg.  She does report a burning pain down her bilateral legs on the posterior side going into the dorsum of her feet.  See HPI and/or previous note for associated ROS.  Objective: BP (!) 152/74   Ht 5\' 1"  (1.549 m)   Wt 127 lb (57.6 kg)   LMP 10/13/1999   BMI 24.00 kg/m  Gen: Right-Hand Dominant. NAD, well groomed, a/o x3, normal affect.  CV: Well-perfused. Warm.  Resp: Non-labored.  Neuro: Sensation intact throughout. No gross coordination deficits.  Gait: Nonpathologic posture, unremarkable stride without signs of limp or balance issues.  Back exam: No erythema, warmth, swelling noted.  Step-off noticed around the level of L4-L5.  No tenderness palpation on exam.  Full range of motion in forward flexion.  Range of motion limited by around 5 degrees and extension of back.  Full range of motion cytocide motion.  Strength 5 out of 5 throughout testing.  Negative  straight leg raise bilaterally.  Negative FABER and FADIR testing.  Negative logroll test.  Assessment and plan: Low back pain, likely secondary to anteriorlisthesis of L3-L4, spondylolisthesis of L5-S1, and associating spinal stenosis.  We discussed treatment options at today's visit.  She did have a lot of improvement with home exercises that were given to her at the last visit.  We advised that she continue to do these.  She should back off a golf, if it is giving her symptoms.  She can play tennis with restrictions on the amount of extension she puts into her back.  We will start her back on Flexeril at nighttime to see if she can get some nighttime relief to help with the morning pain.  We will see her back as needed in clinic.  If she does not have any improvement at follow-up, I would consider getting MRI of the lower back to further evaluate.  Lewanda Rife, MD [facility] Fellow 12/06/2018 2:05 PM  I observed and examined the patient with the [facility] Fellow and agree with assessment and plan.  Note reviewed and modified by me. Ila Mcgill, MD

## 2019-01-24 ENCOUNTER — Ambulatory Visit (INDEPENDENT_AMBULATORY_CARE_PROVIDER_SITE_OTHER): Payer: Medicare Other | Admitting: Sports Medicine

## 2019-01-24 ENCOUNTER — Encounter: Payer: Self-pay | Admitting: Sports Medicine

## 2019-01-24 ENCOUNTER — Ambulatory Visit: Payer: Medicare Other | Admitting: Sports Medicine

## 2019-01-24 ENCOUNTER — Other Ambulatory Visit: Payer: Self-pay

## 2019-01-24 DIAGNOSIS — M48061 Spinal stenosis, lumbar region without neurogenic claudication: Secondary | ICD-10-CM | POA: Diagnosis not present

## 2019-01-24 MED ORDER — GABAPENTIN 300 MG PO CAPS: 300.0000 mg | ORAL_CAPSULE | Freq: Two times a day (BID) | ORAL | 2 refills | Status: DC

## 2019-01-24 NOTE — Progress Notes (Signed)
CC;  73 yo athletic F with radicular LBP  Consent given for video follow up visit  First visit 10/24 Weakness of HS step off at L5/S1 XR shows lower lumbar anterolithesis/ bone spurs HEP to emphasize flexion  Visit 2 Feb. 25 Much improved on HEP Pain primarily with golf Add flexeril  Visit April 14 Doing well in general Still has morning radicular sxs into both legs Flexeril did not change this and now has stopped Has played tennis 2 x week with no problem With walking she will do a flexion stretch or sit down and sxs go away in 5 mins or less Feels that her strength is improving  PE Unable to do exam but did observe the following on video Full back flexion Good back rotation and side bending Back extension is tight and limited No pain on 2 leg or 1 leg back extension  Able to walk on toes Able to step up 2 steps on either leg with no weakness

## 2019-01-24 NOTE — Assessment & Plan Note (Signed)
She continues to improve on HEP Able to do tennis and walking Will try golf again but use a lumbar support and try to limit extension on swing  Want to try gabapentin to stop morning sxs Start 300 hs After 1 week 300 bid Then progress if tolerated well  We discussed side effects  I spent 25 minutes with this patient. Over 50% of visit was spend in counseling and coordination of care for problems with spinal stenosis and cautions on new medication.

## 2019-02-06 ENCOUNTER — Other Ambulatory Visit: Payer: Self-pay | Admitting: Family Medicine

## 2019-02-06 DIAGNOSIS — I10 Essential (primary) hypertension: Secondary | ICD-10-CM

## 2019-02-07 ENCOUNTER — Telehealth: Payer: Self-pay | Admitting: *Deleted

## 2019-02-07 NOTE — Telephone Encounter (Signed)
Per Dr Oneida Alar, patient can add a midday dose to her current one in the morning and one at night regimen.  Called patient and told her the new regimen is now: Gabapentin 300mg  in the morning, 300mg  midday, and 300mg  at night.  Patient voiced understanding

## 2019-02-08 DIAGNOSIS — L720 Epidermal cyst: Secondary | ICD-10-CM | POA: Diagnosis not present

## 2019-03-03 ENCOUNTER — Telehealth: Payer: Medicare Other | Admitting: Nurse Practitioner

## 2019-03-03 DIAGNOSIS — M5441 Lumbago with sciatica, right side: Secondary | ICD-10-CM

## 2019-03-03 NOTE — Progress Notes (Signed)
Based on what you shared with me it looks like you have radiculopathy of your lumbar spine,that should be evaluated in a face to face office visit. I am sorry but we cannot place those orders in an evisit. That must be ordered by your primary care  Provider.   NOTE: If you entered your credit card information for this eVisit, you will not be charged. You may see a "hold" on your card for the $30 but that hold will drop off and you will not have a charge processed.  If you are having a true medical emergency please call 911.  If you need an urgent face to face visit, [facility] has four urgent care centers for your convenience.  If you need care fast and have a high deductible or no insurance consider:   [email or web address] to reserve your spot online an avoid wait times  [facility] [address], [address] [place], [place] [address] 8 am to 8 pm Monday-Friday 10 am to 4 pm Saturday-Sunday *Across the street from [employer]  [place], [address] 8 am to 5 pm Monday-Friday * In the [facility] on the [facility]   The following sites will take your  insurance:  . [facility] [place] a Provider at this Location  [address] [place], [place] [address] . 10 am to 8 pm Monday-Friday . 12 pm to 8 pm Saturday-Sunday   . [facility] at [place] a Provider at this Location  [address] [place], [place], [place] [address] . 8 am to 8 pm Monday-Friday . 9 am to 6 pm Saturday . 11 am to 6 pm Sunday   . [facility] at [place] Get Driving Directions  [address] [place], [place] [address] . 8 am to 8 pm Monday-Friday . 8 am to 4 pm Saturday-Sunday   Your e-visit answers were reviewed by a board certified advanced clinical  practitioner to complete your personal care plan.

## 2019-03-15 ENCOUNTER — Other Ambulatory Visit: Payer: Self-pay

## 2019-03-16 ENCOUNTER — Encounter

## 2019-03-16 ENCOUNTER — Encounter: Payer: Self-pay | Admitting: Sports Medicine

## 2019-03-16 ENCOUNTER — Ambulatory Visit (INDEPENDENT_AMBULATORY_CARE_PROVIDER_SITE_OTHER): Payer: Medicare Other | Admitting: Sports Medicine

## 2019-03-16 ENCOUNTER — Ambulatory Visit: Payer: Self-pay

## 2019-03-16 VITALS — BP 154/82 | Ht 61.0 in | Wt 125.0 lb

## 2019-03-16 DIAGNOSIS — M25561 Pain in right knee: Secondary | ICD-10-CM

## 2019-03-16 DIAGNOSIS — S76311A Strain of muscle, fascia and tendon of the posterior muscle group at thigh level, right thigh, initial encounter: Secondary | ICD-10-CM | POA: Diagnosis not present

## 2019-03-16 NOTE — Progress Notes (Signed)
  Tammy Leonard - 73 y.o. female MRN [id number]  Date of birth: 09/22/1946    SUBJECTIVE:      Chief Complaint: Right hamstring pain  HPI:  73 year old female with right hamstring pain for 2 weeks.  Patient states she was doing a deep stretch in yoga and felt a pop in the distal third of her hamstrings.  She was able to finish the class.  Improved over the following week with rest.  She then try to resume playing tennis and had significant increase in pain for 6 days up to 9/10.  Currently it is 3/10 and some intermittent, worse with activity and walking.  She denies any new numbness or tingling in the lower extremity.  Occasional feeling of weakness due to pain.  No bruising noted.  No erythema or significant swelling noted   ROS:     See HPI. All other reviewed systems negative.  PERTINENT  PMH / PSH FH / / SH:  Past Medical, Surgical, Social, and Family History Reviewed & Updated in the EMR.  Pertinent findings include:  Spinal stenosis  OBJECTIVE: BP (!) 154/82   Ht 5\' 1"  (1.549 m)   Wt 125 lb (56.7 kg)   LMP 10/13/1999   BMI 23.62 kg/m   Physical Exam:  Vital signs are reviewed.  GEN: Alert and oriented, NAD Pulm: Breathing unlabored PSY: normal mood, congruent affect  MSK: Right hip/thigh:  - Inspection: No gross deformity, no swelling, erythema, or ecchymosis - Palpation: She has some mild reproducible tenderness with palpation around the posterior midline of the distal third of the hamstrings - ROM: Normal range of motion on Flexion abduction, internal and external rotation - Strength: Normal strength.  Hamstring pain noted with resisted knee flexion - Neuro/vasc: NV intact distally - Special Tests: Negative FABER and FADIR.  She is able to get just shy of 90 degrees is with a H-test  Left hip/thigh: Full range of motion 5/5 strength N/V intact distally Patient able achieve 90+ degrees with H-test    ASSESSMENT & PLAN:  1.  Right hamstring strain, likely at  the intersection of the biceps femoris and semimembranosus/tendinosis.  Overall she is significantly improved since onset -Recommend compression thigh sleeve - NSAIDs as needed - May do tennis drills, but avoid full activity at this time -Patient instructed on [place] rehab exercises to be done twice daily -May continue yoga but avoid hamstring stretching that causes pain - Never follow-up in 4 weeks if needed, [place] if not improving  2.  Spinal stenosis-overall her radicular symptoms have been overshadowed by her hamstring pain, but as this improves these radicular symptoms are becoming more noticeable again.  She is currently taking gabapentin 300 mg 3 times daily feels it is not helping much.  We will increase her nighttime dose to 600 mg and keep the 2 daytime doses at 300 mg.  She will check back in about 4 weeks, if not noticing any benefit we will increase nighttime dose further to 900 mg as long as she is tolerating well.   I observed and examined the patient with Dr Okey Dupre and agree with assessment and plan.  Note reviewed and modified by me. Ila Mcgill, MD

## 2019-03-22 ENCOUNTER — Other Ambulatory Visit: Payer: Self-pay | Admitting: Obstetrics & Gynecology

## 2019-03-22 DIAGNOSIS — Z1231 Encounter for screening mammogram for malignant neoplasm of breast: Secondary | ICD-10-CM

## 2019-03-28 ENCOUNTER — Ambulatory Visit
Admission: RE | Admit: 2019-03-28 | Discharge: 2019-03-28 | Disposition: A | Discharge status: Home / Self Care | Payer: Medicare Other | Source: Ambulatory Visit | Attending: Obstetrics & Gynecology | Admitting: Obstetrics & Gynecology

## 2019-03-28 ENCOUNTER — Other Ambulatory Visit: Payer: Self-pay

## 2019-03-28 DIAGNOSIS — Z1231 Encounter for screening mammogram for malignant neoplasm of breast: Secondary | ICD-10-CM

## 2019-04-01 ENCOUNTER — Other Ambulatory Visit: Payer: Self-pay | Admitting: Sports Medicine

## 2019-04-03 ENCOUNTER — Other Ambulatory Visit: Payer: Self-pay | Admitting: *Deleted

## 2019-04-03 MED ORDER — GABAPENTIN 300 MG PO CAPS: 300.0000 mg | ORAL_CAPSULE | Freq: Four times a day (QID) | ORAL | 1 refills | Status: DC

## 2019-04-27 DIAGNOSIS — H524 Presbyopia: Secondary | ICD-10-CM | POA: Diagnosis not present

## 2019-04-27 DIAGNOSIS — H40013 Open angle with borderline findings, low risk, bilateral: Secondary | ICD-10-CM | POA: Diagnosis not present

## 2019-04-27 DIAGNOSIS — H25813 Combined forms of age-related cataract, bilateral: Secondary | ICD-10-CM | POA: Diagnosis not present

## 2019-06-06 ENCOUNTER — Telehealth: Payer: Self-pay | Admitting: *Deleted

## 2019-06-06 ENCOUNTER — Other Ambulatory Visit: Payer: Self-pay | Admitting: Sports Medicine

## 2019-06-06 NOTE — Telephone Encounter (Signed)
[facility] [address] [place] [address] Dr Vonzell Schlatter 10.19.20 @ 230p

## 2019-06-08 DIAGNOSIS — H02834 Dermatochalasis of left upper eyelid: Secondary | ICD-10-CM | POA: Diagnosis not present

## 2019-06-08 DIAGNOSIS — H02832 Dermatochalasis of right lower eyelid: Secondary | ICD-10-CM | POA: Diagnosis not present

## 2019-06-08 DIAGNOSIS — H02835 Dermatochalasis of left lower eyelid: Secondary | ICD-10-CM | POA: Diagnosis not present

## 2019-06-08 DIAGNOSIS — H02831 Dermatochalasis of right upper eyelid: Secondary | ICD-10-CM | POA: Diagnosis not present

## 2019-06-15 ENCOUNTER — Other Ambulatory Visit: Payer: Self-pay | Admitting: Family Medicine

## 2019-06-15 DIAGNOSIS — I10 Essential (primary) hypertension: Secondary | ICD-10-CM

## 2019-06-15 MED ORDER — AMLODIPINE BESYLATE 2.5 MG PO TABS: 2.5000 mg | ORAL_TABLET | Freq: Every day | ORAL | 0 refills | Status: DC

## 2019-06-18 ENCOUNTER — Encounter: Payer: Self-pay | Admitting: Family Medicine

## 2019-06-19 DIAGNOSIS — L718 Other rosacea: Secondary | ICD-10-CM | POA: Diagnosis not present

## 2019-06-19 DIAGNOSIS — R21 Rash and other nonspecific skin eruption: Secondary | ICD-10-CM | POA: Diagnosis not present

## 2019-06-22 DIAGNOSIS — Z85828 Personal history of other malignant neoplasm of skin: Secondary | ICD-10-CM | POA: Diagnosis not present

## 2019-06-22 DIAGNOSIS — L570 Actinic keratosis: Secondary | ICD-10-CM | POA: Diagnosis not present

## 2019-06-22 DIAGNOSIS — D2371 Other benign neoplasm of skin of right lower limb, including hip: Secondary | ICD-10-CM | POA: Diagnosis not present

## 2019-06-22 DIAGNOSIS — D235 Other benign neoplasm of skin of trunk: Secondary | ICD-10-CM | POA: Diagnosis not present

## 2019-06-22 DIAGNOSIS — L821 Other seborrheic keratosis: Secondary | ICD-10-CM | POA: Diagnosis not present

## 2019-07-13 DIAGNOSIS — Z23 Encounter for immunization: Secondary | ICD-10-CM | POA: Diagnosis not present

## 2019-07-17 IMAGING — CR DG LUMBAR SPINE COMPLETE 4+V
5 series · 5 of 5 positions shown · non-contrast
Comparison: None.

CLINICAL DATA: Back pain and hamstring pain, initial encounter

EXAM:
LUMBAR SPINE - COMPLETE 4+ VIEW

[t l-spine a.p.]
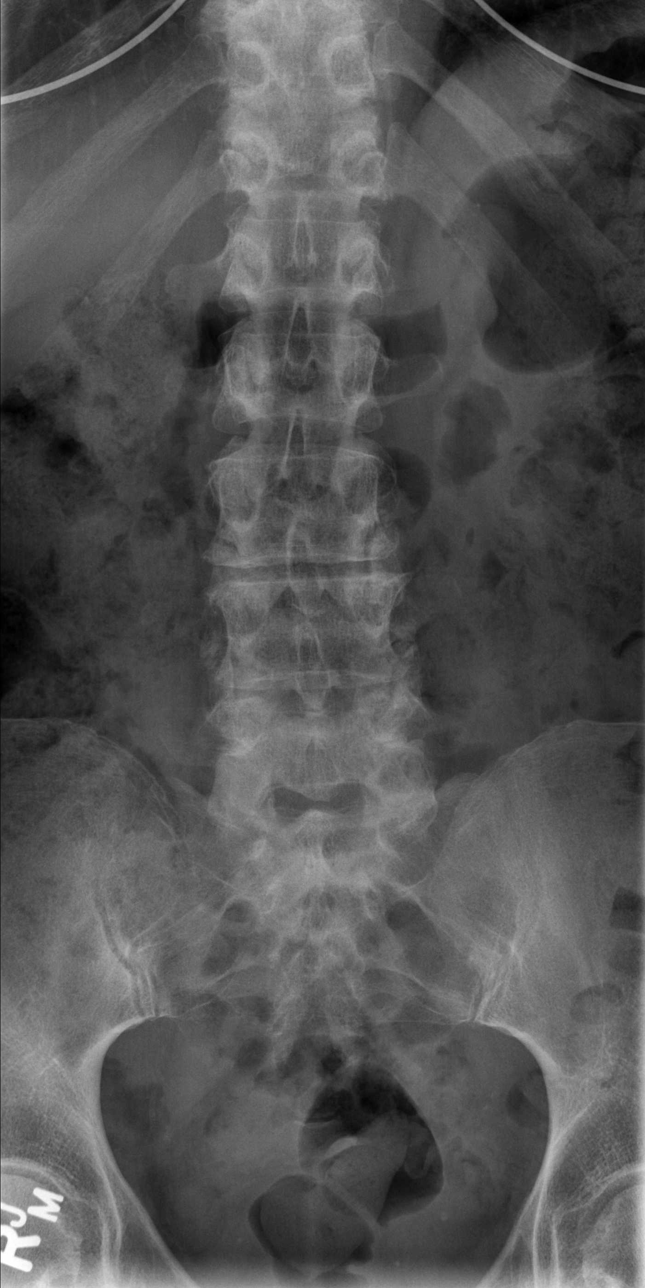

[t l-spine oblique exposure (1 of 2)]
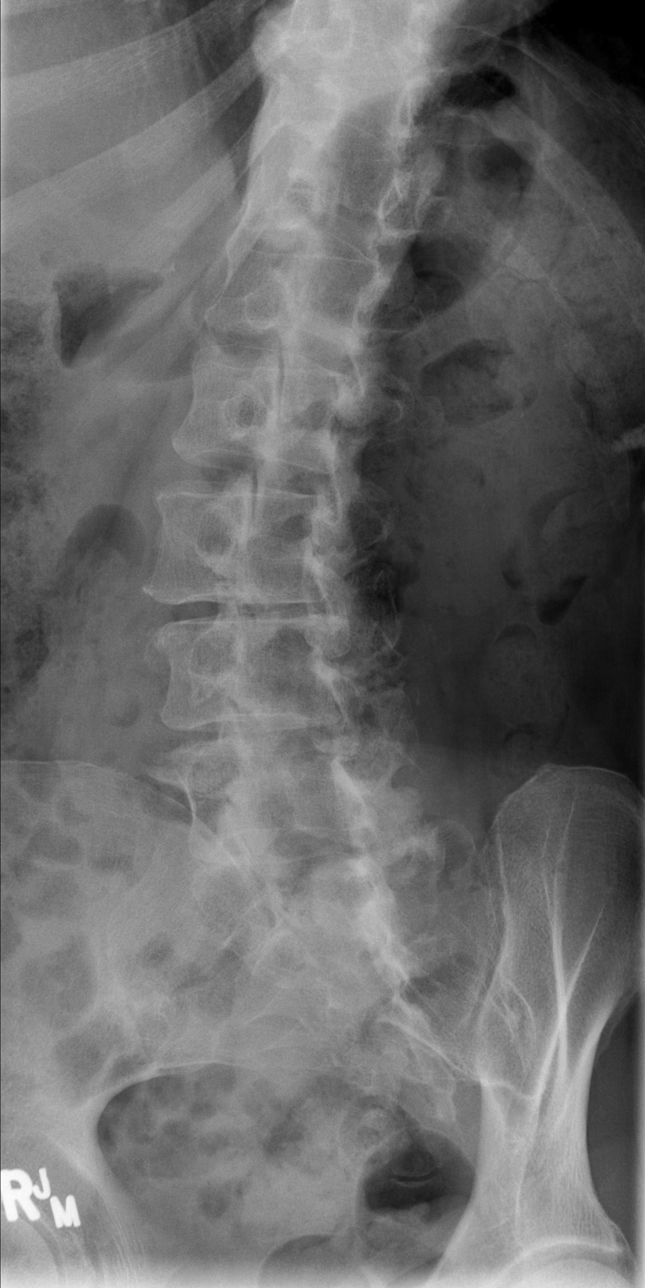

[t l-spine oblique exposure (2 of 2)]
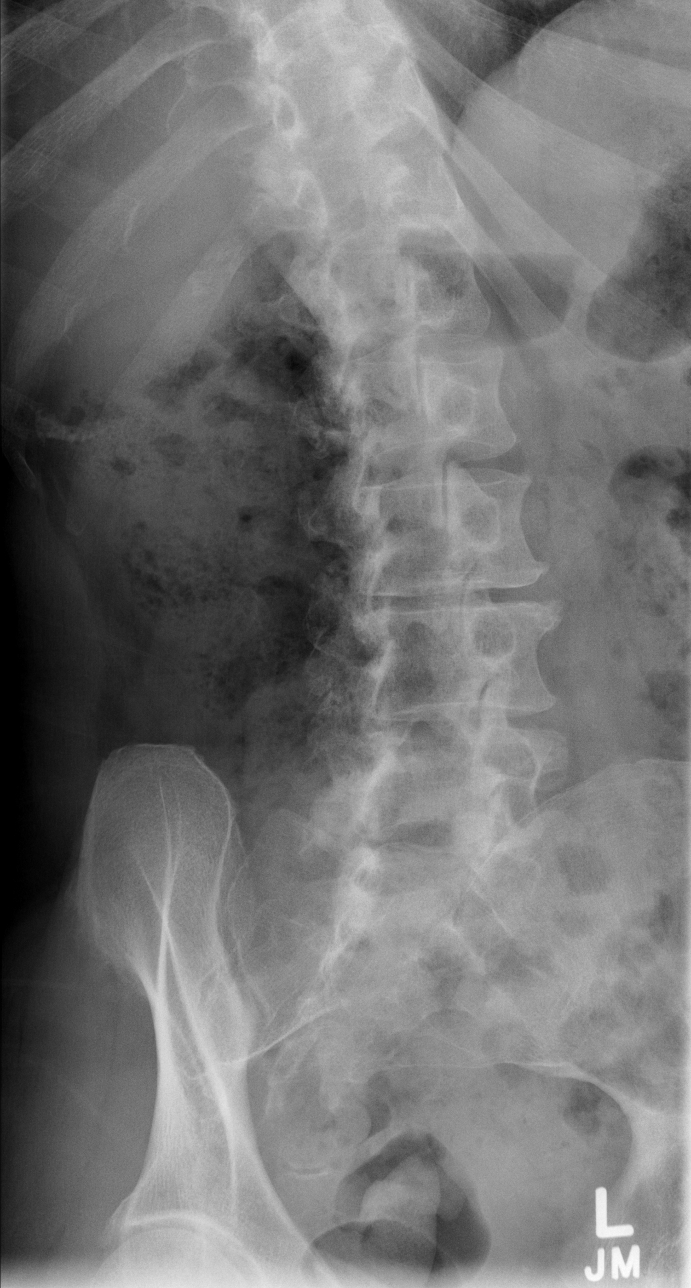

[t l-spine lat]
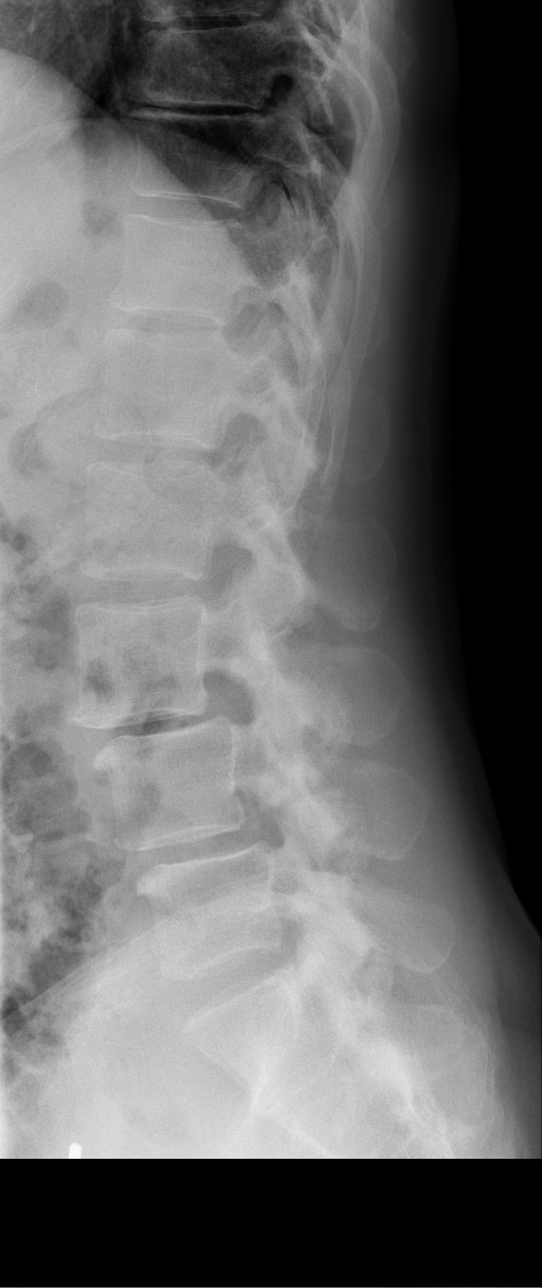

[t l-spine l5-s1 spot]
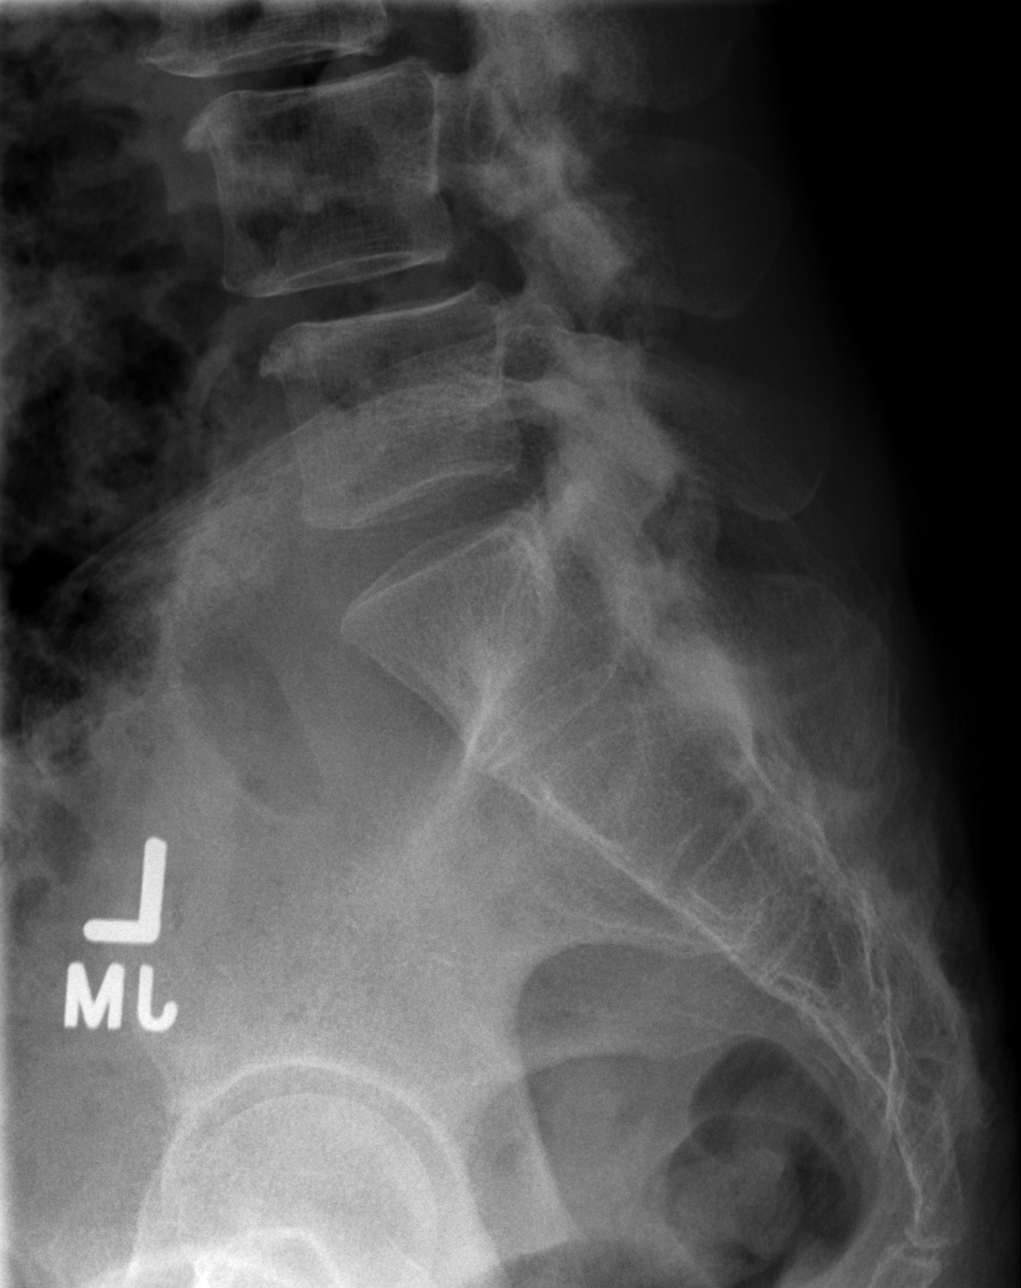

[5 of 5 positions shown; findings below may reference images not displayed]

FINDINGS: Five lumbar type vertebral bodies are well visualized. Vertebral
body height is well maintained. Mild degenerative anterolisthesis of
L3 on L4 and L4 on L5 is seen. No pars defects are noted. No soft
tissue abnormality is seen.
IMPRESSION: Degenerative changes with mild anterolisthesis of L3 on L4 and L4 on
L5 as described.

## 2019-07-19 DIAGNOSIS — M65312 Trigger thumb, left thumb: Secondary | ICD-10-CM | POA: Diagnosis not present

## 2019-07-19 DIAGNOSIS — M79645 Pain in left finger(s): Secondary | ICD-10-CM | POA: Diagnosis not present

## 2019-07-31 DIAGNOSIS — M545 Low back pain: Secondary | ICD-10-CM | POA: Diagnosis not present

## 2019-07-31 DIAGNOSIS — M4316 Spondylolisthesis, lumbar region: Secondary | ICD-10-CM | POA: Diagnosis not present

## 2019-07-31 DIAGNOSIS — M5416 Radiculopathy, lumbar region: Secondary | ICD-10-CM | POA: Diagnosis not present

## 2019-07-31 DIAGNOSIS — I10 Essential (primary) hypertension: Secondary | ICD-10-CM | POA: Diagnosis not present

## 2019-07-31 DIAGNOSIS — M4126 Other idiopathic scoliosis, lumbar region: Secondary | ICD-10-CM | POA: Diagnosis not present

## 2019-08-28 DIAGNOSIS — M79645 Pain in left finger(s): Secondary | ICD-10-CM | POA: Diagnosis not present

## 2019-08-28 DIAGNOSIS — M65312 Trigger thumb, left thumb: Secondary | ICD-10-CM | POA: Diagnosis not present

## 2019-09-06 DIAGNOSIS — M48061 Spinal stenosis, lumbar region without neurogenic claudication: Secondary | ICD-10-CM | POA: Diagnosis not present

## 2019-09-06 DIAGNOSIS — M5416 Radiculopathy, lumbar region: Secondary | ICD-10-CM | POA: Diagnosis not present

## 2019-09-06 DIAGNOSIS — M4126 Other idiopathic scoliosis, lumbar region: Secondary | ICD-10-CM | POA: Diagnosis not present

## 2019-09-06 DIAGNOSIS — I10 Essential (primary) hypertension: Secondary | ICD-10-CM | POA: Diagnosis not present

## 2019-09-06 DIAGNOSIS — M545 Low back pain: Secondary | ICD-10-CM | POA: Diagnosis not present

## 2019-09-06 DIAGNOSIS — M4316 Spondylolisthesis, lumbar region: Secondary | ICD-10-CM | POA: Diagnosis not present

## 2019-09-15 ENCOUNTER — Other Ambulatory Visit: Payer: Self-pay | Admitting: Family Medicine

## 2019-09-15 DIAGNOSIS — I10 Essential (primary) hypertension: Secondary | ICD-10-CM

## 2019-09-16 DIAGNOSIS — H02831 Dermatochalasis of right upper eyelid: Secondary | ICD-10-CM | POA: Diagnosis not present

## 2019-09-16 DIAGNOSIS — H02834 Dermatochalasis of left upper eyelid: Secondary | ICD-10-CM | POA: Diagnosis not present

## 2019-09-16 DIAGNOSIS — Z20828 Contact with and (suspected) exposure to other viral communicable diseases: Secondary | ICD-10-CM | POA: Diagnosis not present

## 2019-09-19 DIAGNOSIS — H02834 Dermatochalasis of left upper eyelid: Secondary | ICD-10-CM | POA: Diagnosis not present

## 2019-09-19 DIAGNOSIS — Z79899 Other long term (current) drug therapy: Secondary | ICD-10-CM | POA: Diagnosis not present

## 2019-09-19 DIAGNOSIS — H02831 Dermatochalasis of right upper eyelid: Secondary | ICD-10-CM | POA: Diagnosis not present

## 2019-09-19 DIAGNOSIS — I10 Essential (primary) hypertension: Secondary | ICD-10-CM | POA: Diagnosis not present

## 2019-10-19 ENCOUNTER — Other Ambulatory Visit: Payer: Self-pay | Admitting: Family Medicine

## 2019-10-19 DIAGNOSIS — I10 Essential (primary) hypertension: Secondary | ICD-10-CM

## 2019-10-19 NOTE — Telephone Encounter (Signed)
Last OV 03/03/19 Last med check 08/01/18 Last refill 09/15/19 #30/0 Next OV 11/02/19

## 2019-10-31 DIAGNOSIS — M48061 Spinal stenosis, lumbar region without neurogenic claudication: Secondary | ICD-10-CM | POA: Diagnosis not present

## 2019-11-01 ENCOUNTER — Other Ambulatory Visit: Payer: Self-pay

## 2019-11-01 NOTE — Progress Notes (Addendum)
[facility] [address] [place], [place] [address] [phone]  Date:  11/02/2019   Name:  MISK Leonard   DOB:  [birth year]   MRN:  [id number]  PCP:  Darreld Mclean, MD    Chief Complaint: Medication Refill (labs)   History of Present Illness:  Tammy Leonard is a 74 y.o. very pleasant female patient who presents with the following:  Here today for a follow-up visit/physical exam-patient does have Medicare History of skin cancer, hypertension, spinal stenosis, diverticulitis Last seen by myself in October 2019 Had blepharoplasty in December   She enjoys playing tennis and also golf and is generally very physically active- her back has been holding her back some from her normal activity level recently.   She was taking gabapentin but finally stopped as it was not really helping her  She did see Dr Vertell Limber with [facility]- he encouraged her to continue yoga/ piliates for core strength and they did do an MRI.  Her MRI was worse than expected per her report- they are considering doing an L4/5 spacer and fusion in the future   She had an epidural steroid injection a couple of days ago- we are not sure if this helped her yet  Flu vaccine- done  Mammogram June 2020 Colon cancer screening up-to-date Shingrix?- complete Due for labs- will update  dexa due this spring/ summer   She is looking at getting her covid vaccine soon- she is trying to get an appt  Uses xanax on rare occasion- would like RF to have on hand   BP Readings from Last 3 Encounters:  11/02/19 (!) 144/80  03/16/19 (!) 154/82  12/06/18 (!) 152/74    Patient Active Problem List   Diagnosis Date Noted  . Lumbar canal stenosis 08/04/2018  . Osteopenia 02/04/2018  . Squamous cell carcinoma in situ (SCCIS) of skin 01/05/2018  . History of diverticulitis 12/16/2016  . History of cryosurgery 08/28/2016  . Elevated blood pressure 08/22/2013    Past  Medical History:  Diagnosis Date  . Abnormal Pap smear of cervix   . Anxiety   . Chlamydia    h/o  . Diverticulitis   . Hypertension   . Osteopenia    hip and spine  . Renal cyst    stable CT 5/11 [place] 10/12  . SCC (squamous cell carcinoma), arm, left 12/2017    Past Surgical History:  Procedure Laterality Date  . GYNECOLOGIC CRYOSURGERY      Social History   Tobacco Use  . Smoking status: Never Smoker  . Smokeless tobacco: Never Used  Substance Use Topics  . Alcohol use: Yes    Alcohol/week: 7.0 standard drinks    Types: 7 Standard drinks or equivalent per week    Comment: glass of wine daily  . Drug use: No    Family History  Problem Relation Age of Onset  . Heart disease Mother   . Breast cancer Mother 19       and stomach cancer  . Mitral valve prolapse Mother   . Heart disease Father   . Prostate cancer Father   . Depression Daughter     Allergies  Allergen Reactions  . Prochlorperazine Edisylate Anxiety and Other (See Comments)  . Lisinopril     Patient had a respiratory illness with cough and change made in her blood pressure medication. She did not have an allergic reaction or angioedema  Medication list has been reviewed and updated.  Current Outpatient Medications on File Prior to Visit  Medication Sig Dispense Refill  . ALPRAZolam (XANAX) 0.5 MG tablet Take 1 tablet (0.5 mg total) by mouth at bedtime as needed. 30 tablet 1  . amLODipine (NORVASC) 2.5 MG tablet TAKE 1 TABLET BY MOUTH DAILY. 30 tablet 0  . ampicillin (PRINCIPEN) 500 MG capsule Take 500 mg by mouth daily. 5 x week    . B Complex Vitamins (VITAMIN B COMPLEX PO) Take by mouth daily.    Marland Kitchen CALCIUM PO Take by mouth. + magnesium and vitamin d daily    . Multiple Vitamins-Minerals (MULTIVITAMIN PO) Take by mouth daily.     No current facility-administered medications on file prior to visit.    Review of Systems:  As per HPI- otherwise negative. No CP or SOB, no PMB  Physical  Examination: Vitals:   11/02/19 0854  BP: (!) 144/80  Pulse: 66  Resp: 16  Temp: 97.7 F (36.5 C)  SpO2: 98%   Vitals:   11/02/19 0854  Weight: 130 lb (59 kg)  Height: 5\' 1"  (1.549 m)   Body mass index is 24.56 kg/m. Ideal Body Weight: Weight in (lb) to have BMI = 25: 132  GEN: WDWN, NAD, Non-toxic, A & O x 3, normal weight, fit build, looks well  HEENT: Atraumatic, Normocephalic. Neck supple. No masses, No LAD.  Recent eyelid lift looks great  Ears and Nose: No external deformity. CV: RRR, No M/G/R. No JVD. No thrill. No extra heart sounds. PULM: CTA B, no wheezes, crackles, rhonchi. No retractions. No resp. distress. No accessory muscle use. ABD: S, NT, ND, +BS. No rebound. No HSM. EXTR: No c/c/e NEURO Normal gait.  PSYCH: Normally interactive. Conversant. Not depressed or anxious appearing.  Calm demeanor.    Assessment and Plan: Benign essential HTN - Plan: CBC, Comprehensive metabolic panel  Screening for deficiency anemia - Plan: CBC  Screening for diabetes mellitus - Plan: Hemoglobin A1c  Elevated glucose - Plan: Hemoglobin A1c  Screening for hyperlipidemia - Plan: Lipid panel  Screening for thyroid disorder - Plan: TSH  Essential hypertension, benign - Plan: amLODipine (NORVASC) 2.5 MG tablet  Anxiety - Plan: ALPRAZolam (XANAX) 0.5 MG tablet  Osteopenia, unspecified location - Plan: DG Bone Density  Estrogen deficiency - Plan: DG Bone Density  Following up today Update labs Monitor BP- consider increasing amlodipine to 5 mg Ordered bone density Discussed her back issues- following with NSG This visit occurred during the SARS-CoV-2 public health emergency.  Safety protocols were in place, including screening questions prior to the visit, additional usage of staff PPE, and extensive cleaning of exam room while observing appropriate contact time as indicated for disinfecting solutions.    Signed Lamar Blinks, MD  Received her labs as below,  message to patient Thyroid is in normal range Cholesterol is overall quite favorable, thanks to your excellent HDL Your A1c remains stable in the prediabetes range, no change.  Continue to be active Your calcium level is slightly high.  I suspect this is due to lab variation, but would like to recheck in 1 to 2 months to make sure it returns to normal.  The same is true for your blood counts;your white cell count and platelet counts are slightly high this time-likely benign variation  I will place lab only orders for repeat blood count and calcium level.  Please make a lab only appointment in 1 to 2 months at your convenience, otherwise  we can plan to recheck in 6 months  Results for orders placed or performed in visit on 11/02/19  CBC  Result Value Ref Range   WBC 11.3 (H) 4.0 - 10.5 K/uL   RBC 4.61 3.87 - 5.11 Mil/uL   Platelets 422.0 (H) 150.0 - 400.0 K/uL   Hemoglobin 14.2 12.0 - 15.0 g/dL   HCT 42.9 36.0 - 46.0 %   MCV 92.9 78.0 - 100.0 fl   MCHC 33.2 30.0 - 36.0 g/dL   RDW 13.7 11.5 - 15.5 %  Comprehensive metabolic panel  Result Value Ref Range   Sodium 138 135 - 145 mEq/L   Potassium 4.3 3.5 - 5.1 mEq/L   Chloride 100 96 - 112 mEq/L   CO2 28 19 - 32 mEq/L   Glucose, Bld 108 (H) 70 - 99 mg/dL   BUN 23 6 - 23 mg/dL   Creatinine, Ser 0.87 0.40 - 1.20 mg/dL   Total Bilirubin 0.4 0.2 - 1.2 mg/dL   Alkaline Phosphatase 57 39 - 117 U/L   AST 21 0 - 37 U/L   ALT 21 0 - 35 U/L   Total Protein 7.6 6.0 - 8.3 g/dL   Albumin 5.0 3.5 - 5.2 g/dL   GFR 64.06 >60.00 mL/min   Calcium 10.9 (H) 8.4 - 10.5 mg/dL  Hemoglobin A1c  Result Value Ref Range   Hgb A1c MFr Bld 5.9 4.6 - 6.5 %  Lipid panel  Result Value Ref Range   Cholesterol 228 (H) 0 - 200 mg/dL   Triglycerides 93.0 0.0 - 149.0 mg/dL   HDL 121.30 >39.00 mg/dL   VLDL 18.6 0.0 - 40.0 mg/dL   LDL Cholesterol 88 0 - 99 mg/dL   Total CHOL/HDL Ratio 2    NonHDL 106.86   TSH  Result Value Ref Range   TSH 0.39 0.35 - 4.50  uIU/mL

## 2019-11-01 NOTE — Patient Instructions (Addendum)
It was great to see you again today, I will be in touch with your labs ASAP Best of luck with your back!  Please do monitor your BP at home- if you are running higher than 135/85 I would suggest increasing amlodipine to 5 mg I ordered a bone density for you to have done with your next mammogram over the summer    Health Maintenance After Age 72 After age 77, you are at a higher risk for certain long-term diseases and infections as well as injuries from falls. Falls are a major cause of broken bones and head injuries in people who are older than age 22. Getting regular preventive care can help to keep you healthy and well. Preventive care includes getting regular testing and making lifestyle changes as recommended by your health care provider. Talk with your health care provider about:  Which screenings and tests you should have. A screening is a test that checks for a disease when you have no symptoms.  A diet and exercise plan that is right for you. What should I know about screenings and tests to prevent falls? Screening and testing are the best ways to find a health problem early. Early diagnosis and treatment give you the best chance of managing medical conditions that are common after age 20. Certain conditions and lifestyle choices may make you more likely to have a fall. Your health care provider may recommend:  Regular vision checks. Poor vision and conditions such as cataracts can make you more likely to have a fall. If you wear glasses, make sure to get your prescription updated if your vision changes.  Medicine review. Work with your health care provider to regularly review all of the medicines you are taking, including over-the-counter medicines. Ask your health care provider about any side effects that may make you more likely to have a fall. Tell your health care provider if any medicines that you take make you feel dizzy or sleepy.  Osteoporosis screening. Osteoporosis is a  condition that causes the bones to get weaker. This can make the bones weak and cause them to break more easily.  Blood pressure screening. Blood pressure changes and medicines to control blood pressure can make you feel dizzy.  Strength and balance checks. Your health care provider may recommend certain tests to check your strength and balance while standing, walking, or changing positions.  Foot health exam. Foot pain and numbness, as well as not wearing proper footwear, can make you more likely to have a fall.  Depression screening. You may be more likely to have a fall if you have a fear of falling, feel emotionally low, or feel unable to do activities that you used to do.  Alcohol use screening. Using too much alcohol can affect your balance and may make you more likely to have a fall. What actions can I take to lower my risk of falls? General instructions  Talk with your health care provider about your risks for falling. Tell your health care provider if: ? You fall. Be sure to tell your health care provider about all falls, even ones that seem minor. ? You feel dizzy, sleepy, or off-balance.  Take over-the-counter and prescription medicines only as told by your health care provider. These include any supplements.  Eat a healthy diet and maintain a healthy weight. A healthy diet includes low-fat dairy products, low-fat (lean) meats, and fiber from whole grains, beans, and lots of fruits and vegetables. Home safety  Remove any tripping  hazards, such as rugs, cords, and clutter.  Install safety equipment such as grab bars in bathrooms and safety rails on stairs.  Keep rooms and walkways well-lit. Activity   Follow a regular exercise program to stay fit. This will help you maintain your balance. Ask your health care provider what types of exercise are appropriate for you.  If you need a cane or walker, use it as recommended by your health care provider.  Wear supportive shoes  that have nonskid soles. Lifestyle  Do not drink alcohol if your health care provider tells you not to drink.  If you drink alcohol, limit how much you have: ? 0-1 drink a day for women. ? 0-2 drinks a day for men.  Be aware of how much alcohol is in your drink. In the [place] one drink equals one typical bottle of beer (12 oz), one-half glass of wine (5 oz), or one shot of hard liquor (1 oz).  Do not use any products that contain nicotine or tobacco, such as cigarettes and e-cigarettes. If you need help quitting, ask your health care provider. Summary  Having a healthy lifestyle and getting preventive care can help to protect your health and wellness after age 15.  Screening and testing are the best way to find a health problem early and help you avoid having a fall. Early diagnosis and treatment give you the best chance for managing medical conditions that are more common for people who are older than age 33.  Falls are a major cause of broken bones and head injuries in people who are older than age 16. Take precautions to prevent a fall at home.  Work with your health care provider to learn what changes you can make to improve your health and wellness and to prevent falls. This information is not intended to replace advice given to you by your health care provider. Make sure you discuss any questions you have with your health care provider. Document Revised: 01/19/2019 Document Reviewed: 08/11/2017 Elsevier Patient Education  2020 [employer].

## 2019-11-02 ENCOUNTER — Other Ambulatory Visit: Payer: Self-pay

## 2019-11-02 ENCOUNTER — Ambulatory Visit (INDEPENDENT_AMBULATORY_CARE_PROVIDER_SITE_OTHER): Payer: Medicare Other | Admitting: Family Medicine

## 2019-11-02 ENCOUNTER — Encounter: Payer: Self-pay | Admitting: Family Medicine

## 2019-11-02 VITALS — BP 142/84 | HR 66 | Temp 97.7°F | Resp 16 | Ht 61.0 in | Wt 130.0 lb

## 2019-11-02 DIAGNOSIS — Z131 Encounter for screening for diabetes mellitus: Secondary | ICD-10-CM

## 2019-11-02 DIAGNOSIS — E2839 Other primary ovarian failure: Secondary | ICD-10-CM | POA: Diagnosis not present

## 2019-11-02 DIAGNOSIS — M8580 Other specified disorders of bone density and structure, unspecified site: Secondary | ICD-10-CM

## 2019-11-02 DIAGNOSIS — Z1329 Encounter for screening for other suspected endocrine disorder: Secondary | ICD-10-CM | POA: Diagnosis not present

## 2019-11-02 DIAGNOSIS — F419 Anxiety disorder, unspecified: Secondary | ICD-10-CM | POA: Diagnosis not present

## 2019-11-02 DIAGNOSIS — D72829 Elevated white blood cell count, unspecified: Secondary | ICD-10-CM | POA: Diagnosis not present

## 2019-11-02 DIAGNOSIS — Z13220 Encounter for screening for lipoid disorders: Secondary | ICD-10-CM

## 2019-11-02 DIAGNOSIS — I10 Essential (primary) hypertension: Secondary | ICD-10-CM | POA: Diagnosis not present

## 2019-11-02 DIAGNOSIS — R7309 Other abnormal glucose: Secondary | ICD-10-CM

## 2019-11-02 DIAGNOSIS — Z130 Encounter for screening for diseases of the blood and blood-forming organs and certain disorders involving the immune mechanism: Secondary | ICD-10-CM | POA: Diagnosis not present

## 2019-11-02 LAB — COMPREHENSIVE METABOLIC PANEL
ALT: 21 U/L (ref 0–35)
AST: 21 U/L (ref 0–37)
Albumin: 5 g/dL (ref 3.5–5.2)
Alkaline Phosphatase: 57 U/L (ref 39–117)
BUN: 23 mg/dL (ref 6–23)
CO2: 28 mEq/L (ref 19–32)
Calcium: 10.9 mg/dL — ABNORMAL HIGH (ref 8.4–10.5)
Chloride: 100 mEq/L (ref 96–112)
Creatinine, Ser: 0.87 mg/dL (ref 0.40–1.20)
GFR: 64.06 mL/min (ref >60.00)
Glucose, Bld: 108 mg/dL — ABNORMAL HIGH (ref 70–99)
Potassium: 4.3 mEq/L (ref 3.5–5.1)
Sodium: 138 mEq/L (ref 135–145)
Total Bilirubin: 0.4 mg/dL (ref 0.2–1.2)
Total Protein: 7.6 g/dL (ref 6.0–8.3)

## 2019-11-02 LAB — CBC
HCT: 42.9 % (ref 36.0–46.0)
Hemoglobin: 14.2 g/dL (ref 12.0–15.0)
MCHC: 33.2 g/dL (ref 30.0–36.0)
MCV: 92.9 fl (ref 78.0–100.0)
Platelets: 422 10*3/uL — ABNORMAL HIGH (ref 150.0–400.0)
RBC: 4.61 Mil/uL (ref 3.87–5.11)
RDW: 13.7 % (ref 11.5–15.5)
WBC: 11.3 10*3/uL — ABNORMAL HIGH (ref 4.0–10.5)

## 2019-11-02 LAB — TSH: TSH: 0.39 u[IU]/mL (ref 0.35–4.50)

## 2019-11-02 LAB — LIPID PANEL
Cholesterol: 228 mg/dL — ABNORMAL HIGH (ref 0–200)
HDL: 121.3 mg/dL (ref >39.00)
LDL Cholesterol: 88 mg/dL (ref 0–99)
NonHDL: 106.86
Total CHOL/HDL Ratio: 2
Triglycerides: 93 mg/dL (ref 0.0–149.0)
VLDL: 18.6 mg/dL (ref 0.0–40.0)

## 2019-11-02 LAB — HEMOGLOBIN A1C: Hgb A1c MFr Bld: 5.9 % (ref 4.6–6.5)

## 2019-11-02 MED ORDER — AMLODIPINE BESYLATE 2.5 MG PO TABS: 2.5000 mg | ORAL_TABLET | Freq: Every day | ORAL | 3 refills | Status: DC

## 2019-11-02 MED ORDER — ALPRAZOLAM 0.5 MG PO TABS: 0.5000 mg | ORAL_TABLET | Freq: Every evening | ORAL | 1 refills | Status: DC | PRN

## 2019-11-02 NOTE — Addendum Note (Signed)
Addended by: Lamar Blinks C on: 11/02/2019 07:47 PM   Modules accepted: Orders

## 2019-11-06 ENCOUNTER — Ambulatory Visit: Payer: Medicare Other | Attending: Internal Medicine

## 2019-11-06 DIAGNOSIS — Z23 Encounter for immunization: Secondary | ICD-10-CM | POA: Insufficient documentation

## 2019-11-06 NOTE — Progress Notes (Signed)
   Covid-19 Vaccination Clinic  Name:  Tammy Leonard    MRN: [id number] DOB: 12-03-1945  11/06/2019  [name] was observed post Covid-19 immunization for 15 minutes without incidence. She was provided with Vaccine Information Sheet and instruction to access the V-Safe system.   [name] was instructed to call 911 with any severe reactions post vaccine: Marland Kitchen Difficulty breathing  . Swelling of your face and throat  . A fast heartbeat  . A bad rash all over your body  . Dizziness and weakness    Immunizations Administered    Name Date Dose VIS Date Route   Pfizer COVID-19 Vaccine 11/06/2019  9:28 AM 0.3 mL 09/22/2019 Intramuscular   Manufacturer: [place]   Lot: [id number]   [place]: [id number]

## 2019-11-27 ENCOUNTER — Ambulatory Visit: Payer: Medicare Other | Attending: Internal Medicine

## 2019-11-27 DIAGNOSIS — M5416 Radiculopathy, lumbar region: Secondary | ICD-10-CM | POA: Diagnosis not present

## 2019-11-27 DIAGNOSIS — M545 Low back pain: Secondary | ICD-10-CM | POA: Diagnosis not present

## 2019-11-27 DIAGNOSIS — M4316 Spondylolisthesis, lumbar region: Secondary | ICD-10-CM | POA: Diagnosis not present

## 2019-11-27 DIAGNOSIS — Z23 Encounter for immunization: Secondary | ICD-10-CM

## 2019-11-27 DIAGNOSIS — M4126 Other idiopathic scoliosis, lumbar region: Secondary | ICD-10-CM | POA: Diagnosis not present

## 2019-11-27 DIAGNOSIS — I10 Essential (primary) hypertension: Secondary | ICD-10-CM | POA: Diagnosis not present

## 2019-11-27 NOTE — Progress Notes (Signed)
   Covid-19 Vaccination Clinic  Name:  AMOY FARVER    MRN: [id number] DOB: 11/25/1945  11/27/2019  Ms. Grizzel was observed post Covid-19 immunization for 15 minutes without incidence. She was provided with Vaccine Information Sheet and instruction to access the V-Safe system.   Ms. Adelson was instructed to call 911 with any severe reactions post vaccine: Marland Kitchen Difficulty breathing  . Swelling of your face and throat  . A fast heartbeat  . A bad rash all over your body  . Dizziness and weakness    Immunizations Administered    Name Date Dose VIS Date Route   Pfizer COVID-19 Vaccine 11/27/2019  9:50 AM 0.3 mL 09/22/2019 Intramuscular   Manufacturer: [place]   Lot: [id number]   [place]: [id number]

## 2020-01-01 ENCOUNTER — Encounter: Payer: Self-pay | Admitting: Family Medicine

## 2020-01-01 ENCOUNTER — Other Ambulatory Visit (INDEPENDENT_AMBULATORY_CARE_PROVIDER_SITE_OTHER): Payer: Medicare Other

## 2020-01-01 DIAGNOSIS — D72829 Elevated white blood cell count, unspecified: Secondary | ICD-10-CM | POA: Diagnosis not present

## 2020-01-01 LAB — CBC
HCT: 41.6 % (ref 36.0–46.0)
Hemoglobin: 14 g/dL (ref 12.0–15.0)
MCHC: 33.7 g/dL (ref 30.0–36.0)
MCV: 94.1 fl (ref 78.0–100.0)
Platelets: 313 10*3/uL (ref 150.0–400.0)
RBC: 4.42 Mil/uL (ref 3.87–5.11)
RDW: 14.6 % (ref 11.5–15.5)
WBC: 5.9 10*3/uL (ref 4.0–10.5)

## 2020-01-01 NOTE — Addendum Note (Signed)
Addended by: Wynonia Musty A on: 01/01/2020 10:18 AM   Modules accepted: Orders

## 2020-01-03 ENCOUNTER — Encounter: Payer: Self-pay | Admitting: Family Medicine

## 2020-01-03 LAB — PTH, INTACT AND CALCIUM
Calcium: 10.1 mg/dL (ref 8.6–10.4)
PTH: 17 pg/mL (ref 14–64)

## 2020-02-05 DIAGNOSIS — M545 Low back pain: Secondary | ICD-10-CM | POA: Diagnosis not present

## 2020-02-09 DIAGNOSIS — Z03818 Encounter for observation for suspected exposure to other biological agents ruled out: Secondary | ICD-10-CM | POA: Diagnosis not present

## 2020-02-09 DIAGNOSIS — Z20828 Contact with and (suspected) exposure to other viral communicable diseases: Secondary | ICD-10-CM | POA: Diagnosis not present

## 2020-02-22 DIAGNOSIS — Z20822 Contact with and (suspected) exposure to covid-19: Secondary | ICD-10-CM | POA: Diagnosis not present

## 2020-02-22 DIAGNOSIS — M65312 Trigger thumb, left thumb: Secondary | ICD-10-CM | POA: Diagnosis not present

## 2020-02-22 DIAGNOSIS — Z1152 Encounter for screening for COVID-19: Secondary | ICD-10-CM | POA: Diagnosis not present

## 2020-02-22 DIAGNOSIS — M79645 Pain in left finger(s): Secondary | ICD-10-CM | POA: Diagnosis not present

## 2020-03-04 DIAGNOSIS — M545 Low back pain: Secondary | ICD-10-CM | POA: Diagnosis not present

## 2020-03-04 DIAGNOSIS — M5416 Radiculopathy, lumbar region: Secondary | ICD-10-CM | POA: Diagnosis not present

## 2020-03-20 ENCOUNTER — Other Ambulatory Visit: Payer: Self-pay

## 2020-03-21 NOTE — Progress Notes (Signed)
74 y.o. G2P2 Married White or Caucasian female here for annual exam.  Doing well except for low back pain.  Very active and does yoga and pilates.  Seeing Dr. Vertell Limber.  He thinks she will need surgery at some point.  Still able to play golf and tennis.  Denies vaginal bleeding.    Patient's last menstrual period was 10/13/1999.          Sexually active: No.  The current method of family planning is post menopausal status & husband vasectomy.    Exercising: Yes.    golf, tennis & yoga Smoker:  no  Health Maintenance: Pap:  08-26-17 normal, 08-28-16 neg, 08-30-15 neg HPV HR neg History of abnormal Pap:  yes MMG:  03-29-2019 category b density birads 1:neg Colonoscopy:  2018 polyp f/u 64yrs BMD:   2019, t score -1.1   TDaP:  2016 Pneumonia vaccine(s):  2017 Shingrix:   2020 had both done per patient Hep C testing: neg 2017 Screening Labs: done with Dr. Lorelei Pont 12/2019   reports that she has never smoked. She has never used smokeless tobacco. She reports current alcohol use of about 7.0 standard drinks of alcohol per week. She reports that she does not use drugs.  Past Medical History:  Diagnosis Date  . Abnormal Pap smear of cervix   . Anxiety   . Chlamydia    h/o  . Diverticulitis   . Hypertension   . Osteopenia    hip and spine  . Renal cyst    stable CT 5/11 [place] 10/12  . SCC (squamous cell carcinoma), arm, left 12/2017    Past Surgical History:  Procedure Laterality Date  . GYNECOLOGIC CRYOSURGERY      Current Outpatient Medications  Medication Sig Dispense Refill  . ALPRAZolam (XANAX) 0.5 MG tablet Take 1 tablet (0.5 mg total) by mouth at bedtime as needed. 30 tablet 1  . amLODipine (NORVASC) 2.5 MG tablet Take 1 tablet (2.5 mg total) by mouth daily. 90 tablet 3  . ampicillin (PRINCIPEN) 500 MG capsule Take 500 mg by mouth daily. 5 x week    . B Complex Vitamins (VITAMIN B COMPLEX PO) Take by mouth daily.    Marland Kitchen CALCIUM PO Take by mouth. + magnesium and vitamin d daily     . Multiple Vitamins-Minerals (MULTIVITAMIN PO) Take by mouth daily.    Marland Kitchen UNABLE TO FIND Mag R & R (muscle recovery & sleep support     No current facility-administered medications for this visit.    Family History  Problem Relation Age of Onset  . Heart disease Mother   . Breast cancer Mother 19       and stomach cancer  . Mitral valve prolapse Mother   . Heart disease Father   . Prostate cancer Father   . Depression Daughter     Review of Systems  Constitutional: Negative.   HENT: Negative.   Eyes: Negative.   Respiratory: Negative.   Cardiovascular: Negative.   Gastrointestinal: Negative.   Endocrine: Negative.   Genitourinary: Negative.   Musculoskeletal: Negative.   Skin: Negative.   Allergic/Immunologic: Negative.   Neurological: Negative.   Hematological: Negative.   Psychiatric/Behavioral: Negative.     Exam:   BP 124/80   Pulse 72   Temp (!) 97 F (36.1 C) (Skin)   Resp 16   Ht 5' 0.25" (1.53 m)   Wt 125 lb (56.7 kg)   LMP 10/13/1999   BMI 24.21 kg/m   Height: 5' 0.25" (  153 cm)  General appearance: alert, cooperative and appears stated age Head: Normocephalic, without obvious abnormality, atraumatic Neck: no adenopathy, supple, symmetrical, trachea midline and thyroid normal to inspection and palpation Lungs: clear to auscultation bilaterally Breasts: normal appearance, no masses or tenderness Heart: regular rate and rhythm Abdomen: soft, non-tender; bowel sounds normal; no masses,  no organomegaly Extremities: extremities normal, atraumatic, no cyanosis or edema Skin: Skin color, texture, turgor normal. No rashes or lesions Lymph nodes: Cervical, supraclavicular, and axillary nodes normal. No abnormal inguinal nodes palpated Neurologic: Grossly normal   Pelvic: External genitalia:  no lesions              Urethra:  normal appearing urethra with no masses, tenderness or lesions              Bartholins and Skenes: normal                 Vagina:  normal appearing vagina with normal color and discharge, no lesions              Cervix: no lesions              Pap taken: Yes.   Bimanual Exam:  Uterus:  normal size, contour, position, consistency, mobility, non-tender              Adnexa: normal adnexa and no mass, fullness, tenderness               Rectovaginal: Confirms               Anus:  normal sphincter tone, no lesions  Chaperone, Terence Lux, CMA, was present for exam.  A:  Well Woman with normal exam PMP, HRT H/o spinal stenosis in lumbar region Vaginal atrophic changes Husband with HPV related cancer, in remission  P:   Mammogram guidelines reviewed pap smear obtained today Colonoscopy UTD D/w pt BMD.  Will plan to wait another 2-3 to repeat Lab work done with Dr. Lorelei Pont return annually or prn

## 2020-03-22 ENCOUNTER — Other Ambulatory Visit: Payer: Self-pay

## 2020-03-22 ENCOUNTER — Ambulatory Visit (INDEPENDENT_AMBULATORY_CARE_PROVIDER_SITE_OTHER): Payer: Medicare Other | Admitting: Obstetrics & Gynecology

## 2020-03-22 ENCOUNTER — Encounter: Payer: Self-pay | Admitting: Obstetrics & Gynecology

## 2020-03-22 ENCOUNTER — Other Ambulatory Visit (HOSPITAL_COMMUNITY)
Admission: RE | Admit: 2020-03-22 | Discharge: 2020-03-22 | Disposition: A | Discharge status: Home / Self Care | Payer: Medicare Other | Source: Ambulatory Visit | Attending: Obstetrics & Gynecology | Admitting: Obstetrics & Gynecology

## 2020-03-22 VITALS — BP 124/80 | HR 72 | Temp 97.0°F | Resp 16 | Ht 60.25 in | Wt 125.0 lb

## 2020-03-22 DIAGNOSIS — Z124 Encounter for screening for malignant neoplasm of cervix: Secondary | ICD-10-CM | POA: Diagnosis not present

## 2020-03-22 DIAGNOSIS — Z01419 Encounter for gynecological examination (general) (routine) without abnormal findings: Secondary | ICD-10-CM | POA: Diagnosis not present

## 2020-03-25 LAB — CYTOLOGY - PAP: Diagnosis: NEGATIVE

## 2020-03-27 ENCOUNTER — Other Ambulatory Visit: Payer: Self-pay | Admitting: Obstetrics & Gynecology

## 2020-03-27 DIAGNOSIS — Z1231 Encounter for screening mammogram for malignant neoplasm of breast: Secondary | ICD-10-CM

## 2020-04-16 ENCOUNTER — Ambulatory Visit
Admission: RE | Admit: 2020-04-16 | Discharge: 2020-04-16 | Disposition: A | Discharge status: Home / Self Care | Payer: Medicare Other | Source: Ambulatory Visit | Attending: Obstetrics & Gynecology | Admitting: Obstetrics & Gynecology

## 2020-04-16 ENCOUNTER — Other Ambulatory Visit: Payer: Self-pay

## 2020-04-16 DIAGNOSIS — Z1231 Encounter for screening mammogram for malignant neoplasm of breast: Secondary | ICD-10-CM

## 2020-04-17 ENCOUNTER — Encounter: Payer: Self-pay | Admitting: Obstetrics & Gynecology

## 2020-04-17 ENCOUNTER — Telehealth: Payer: Self-pay

## 2020-04-17 NOTE — Telephone Encounter (Signed)
Tammy Leonard  P [facility] Clinical Pool I can't view test results b/c it says I am receiving them the same time as the doctor. I am assuming my results are fine but I have not received any results. Thanks!

## 2020-04-17 NOTE — Telephone Encounter (Signed)
Spoke with patient. Patient states that she is unable to view her pap results in [facility]. Per review of chart pap results were released and message was sent to the patient by [facility]. Attempted to release results again. Patient states she is still unable to see the results. Advised patient will continue to work on this and return call to ensure she can view them.

## 2020-04-25 DIAGNOSIS — M65312 Trigger thumb, left thumb: Secondary | ICD-10-CM | POA: Diagnosis not present

## 2020-04-25 DIAGNOSIS — M79645 Pain in left finger(s): Secondary | ICD-10-CM | POA: Diagnosis not present

## 2020-06-03 DIAGNOSIS — M5416 Radiculopathy, lumbar region: Secondary | ICD-10-CM | POA: Diagnosis not present

## 2020-06-10 ENCOUNTER — Encounter: Payer: Self-pay | Admitting: Family Medicine

## 2020-06-10 DIAGNOSIS — Z20822 Contact with and (suspected) exposure to covid-19: Secondary | ICD-10-CM | POA: Diagnosis not present

## 2020-06-27 DIAGNOSIS — H25813 Combined forms of age-related cataract, bilateral: Secondary | ICD-10-CM | POA: Diagnosis not present

## 2020-06-27 DIAGNOSIS — H40013 Open angle with borderline findings, low risk, bilateral: Secondary | ICD-10-CM | POA: Diagnosis not present

## 2020-06-27 DIAGNOSIS — H524 Presbyopia: Secondary | ICD-10-CM | POA: Diagnosis not present

## 2020-07-06 ENCOUNTER — Other Ambulatory Visit: Payer: Self-pay | Admitting: Family Medicine

## 2020-07-06 DIAGNOSIS — F419 Anxiety disorder, unspecified: Secondary | ICD-10-CM

## 2020-07-06 NOTE — Telephone Encounter (Signed)
Requesting: xanax 0.5 mg Contract:n/a UDS:n/a Last Visit:11/02/19 Next Visit:n/a Last Refill: 11/02/19 (30,1)  Please Advise

## 2020-07-09 DIAGNOSIS — Z23 Encounter for immunization: Secondary | ICD-10-CM | POA: Diagnosis not present

## 2020-07-10 DIAGNOSIS — D2371 Other benign neoplasm of skin of right lower limb, including hip: Secondary | ICD-10-CM | POA: Diagnosis not present

## 2020-07-10 DIAGNOSIS — L578 Other skin changes due to chronic exposure to nonionizing radiation: Secondary | ICD-10-CM | POA: Diagnosis not present

## 2020-07-10 DIAGNOSIS — L821 Other seborrheic keratosis: Secondary | ICD-10-CM | POA: Diagnosis not present

## 2020-07-10 DIAGNOSIS — Z85828 Personal history of other malignant neoplasm of skin: Secondary | ICD-10-CM | POA: Diagnosis not present

## 2020-07-10 DIAGNOSIS — D235 Other benign neoplasm of skin of trunk: Secondary | ICD-10-CM | POA: Diagnosis not present

## 2020-07-10 DIAGNOSIS — L570 Actinic keratosis: Secondary | ICD-10-CM | POA: Diagnosis not present

## 2020-07-10 DIAGNOSIS — D485 Neoplasm of uncertain behavior of skin: Secondary | ICD-10-CM | POA: Diagnosis not present

## 2020-07-11 NOTE — Telephone Encounter (Signed)
Is it okay to close this encounter?

## 2020-07-16 NOTE — Telephone Encounter (Signed)
Per Epic these results have been seen by the patient. Encounter closed.

## 2020-09-02 DIAGNOSIS — M5416 Radiculopathy, lumbar region: Secondary | ICD-10-CM | POA: Diagnosis not present

## 2020-09-24 DIAGNOSIS — M4316 Spondylolisthesis, lumbar region: Secondary | ICD-10-CM | POA: Diagnosis not present

## 2020-09-24 DIAGNOSIS — M5416 Radiculopathy, lumbar region: Secondary | ICD-10-CM | POA: Diagnosis not present

## 2020-11-28 ENCOUNTER — Other Ambulatory Visit: Payer: Self-pay | Admitting: Family Medicine

## 2020-11-28 DIAGNOSIS — I10 Essential (primary) hypertension: Secondary | ICD-10-CM

## 2020-12-01 DIAGNOSIS — R7303 Prediabetes: Secondary | ICD-10-CM | POA: Insufficient documentation

## 2020-12-01 NOTE — Progress Notes (Addendum)
[facility] [address] [place], [place] [address] [phone]  Date:  12/04/2020   Name:  Tammy Leonard   DOB:  1945-11-11   MRN:  [id number]  PCP:  Darreld Mclean, MD    Chief Complaint: Follow-up and Hypertension   History of Present Illness:  Tammy Leonard is a 75 y.o. very pleasant female patient who presents with the following:  Annual follow-up visit today- medicare Last seen by myself about a year ago  History of skin cancer, hypertension, spinal stenosis, diverticulitis, pre-diabetes From our visit last year: She enjoys playing tennis and also golf and is generally very physically active- her back has been holding her back some from her normal activity level recently.   She was taking gabapentin but finally stopped as it was not really helping her She did see Dr Vertell Limber with [facility]- he encouraged her to continue yoga/ piliates for core strength and they did do an MRI.  Her MRI was worse than expected per her report- they are considering doing an L4/5 spacer and fusion in the future   Flu vaccine- pt declines this year  covid booster - done  Colon due this July-reminded shingrix done  Update labs today  mammo UTD GYN care through Dr Sabra Heck  She will ask her about her dexa scan -she is still uncertain about this We did a bone density about 3 years ago, showed osteopenia  She tripped and fell playing tennis this am-she bruised her arm in her behind but otherwise is okay She has noted easier bruising but no bleeding  She is getting a back injection next week. She continues to see NSG on a regular basis This will be her 5th shot - she is getting these every 3 months right now to maintain her comfort   She enjoys both tennis and golf- she enjoys these a lot She does do yoga and Pilates  She monitors her blood pressures at home, notes that they are typically in normal  range  Unfortunately her husband [name] may have a recurrence of his head and neck squamous cell carcinoma, they are in the midst of an evaluation right now for a new spot on his lung Patient Active Problem List   Diagnosis Date Noted  . Prediabetes 12/01/2020  . Lumbar canal stenosis 08/04/2018  . Osteopenia 02/04/2018  . Squamous cell carcinoma in situ (SCCIS) of skin 01/05/2018  . History of diverticulitis 12/16/2016  . History of cryosurgery 08/28/2016  . Elevated blood pressure 08/22/2013    Past Medical History:  Diagnosis Date  . Abnormal Pap smear of cervix   . Anxiety   . Chlamydia    h/o  . Diverticulitis   . Hypertension   . Osteopenia    hip and spine  . Renal cyst    stable CT 5/11 [place] 10/12  . SCC (squamous cell carcinoma), arm, left 12/2017    Past Surgical History:  Procedure Laterality Date  . GYNECOLOGIC CRYOSURGERY      Social History   Tobacco Use  . Smoking status: Never Smoker  . Smokeless tobacco: Never Used  Vaping Use  . Vaping Use: Never used  Substance Use Topics  . Alcohol use: Yes    Alcohol/week: 7.0 standard drinks    Types: 7 Standard drinks or equivalent per week    Comment: glass of wine daily  . Drug use: No    Family History  Problem Relation Age of Onset  . Heart disease Mother   . Breast cancer Mother 70       and stomach cancer  . Mitral valve prolapse Mother   . Heart disease Father   . Prostate cancer Father   . Depression Daughter     Allergies  Allergen Reactions  . Prochlorperazine Edisylate Anxiety and Other (See Comments)  . Lisinopril     Patient had a respiratory illness with cough and change made in her blood pressure medication. She did not have an allergic reaction or angioedema    Medication list has been reviewed and updated.  Current Outpatient Medications on File Prior to Visit  Medication Sig Dispense Refill  . ALPRAZolam (XANAX) 0.5 MG tablet TAKE 1 TABLET AT BEDTIME AS NEEDED. 30 tablet 0   . amLODipine (NORVASC) 2.5 MG tablet Take 1 tablet (2.5 mg total) by mouth daily. 90 tablet 0  . ampicillin (PRINCIPEN) 500 MG capsule Take 500 mg by mouth daily. 5 x week    . B Complex Vitamins (VITAMIN B COMPLEX PO) Take by mouth daily.    Marland Kitchen CALCIUM PO Take by mouth. + magnesium and vitamin d daily    . Multiple Vitamins-Minerals (MULTIVITAMIN PO) Take by mouth daily.    Marland Kitchen UNABLE TO FIND Mag R & R (muscle recovery & sleep support     No current facility-administered medications on file prior to visit.    Review of Systems:  As per HPI- otherwise negative.   Physical Examination: Vitals:   12/04/20 1500  BP: (!) 158/70  Pulse: 87  Resp: 16  SpO2: 97%   Vitals:   12/04/20 1500  Weight: 132 lb (59.9 kg)  Height: 5' 0.25" (1.53 m)   Body mass index is 25.57 kg/m. Ideal Body Weight: Weight in (lb) to have BMI = 25: 128.8  GEN: no acute distress. Normal weight, looks well  HEENT: Atraumatic, Normocephalic.   Bilateral TM wnl, oropharynx normal.  PEERL,EOMI.   Ears and Nose: No external deformity. CV: RRR, No M/G/R. No JVD. No thrill. No extra heart sounds. PULM: CTA B, no wheezes, crackles, rhonchi. No retractions. No resp. distress. No accessory muscle use. ABD: S, NT, ND, +BS. No rebound. No HSM. EXTR: No c/c/e PSYCH: Normally interactive. Conversant.  Examined right elbow.  She has some bruising, no pain with flexion/ extension or pronation/supination, no suspicion of fracture   Assessment and Plan: Benign essential HTN - Plan: CBC, Comprehensive metabolic panel  Prediabetes - Plan: Hemoglobin A1c  Screening for deficiency anemia - Plan: CBC  Screening for hyperlipidemia - Plan: Lipid panel  Screening for thyroid disorder - Plan: TSH  Fatigue, unspecified type - Plan: TSH, VITAMIN D 25 Hydroxy (Vit-D Deficiency, Fractures)  Vitamin D deficiency - Plan: VITAMIN D 25 Hydroxy (Vit-D Deficiency, Fractures)  Patient is here today for follow-up visit.  We note  that her blood pressure is somewhat elevated.  She does check her pressure at home periodically, has not noted any issues.  I asked her to please monitor a bit more closely over the next few weeks and let me know if her blood pressure is above goal  Will plan further follow- up pending labs.  Encouraged continued healthy exercise and diet routine They will update me about Barry's situation once they get news  This visit occurred during the SARS-CoV-2 public health emergency.  Safety protocols were in place, including screening questions prior to the visit, additional usage of staff PPE, and extensive  cleaning of exam room while observing appropriate contact time as indicated for disinfecting solutions.    Signed Lamar Blinks, MD  Received labs 2/24- message to pt  Results for orders placed or performed in visit on 12/04/20  CBC  Result Value Ref Range   WBC 7.7 4.0 - 10.5 K/uL   RBC 4.34 3.87 - 5.11 Mil/uL   Platelets 360.0 150.0 - 400.0 K/uL   Hemoglobin 13.6 12.0 - 15.0 g/dL   HCT 40.5 36.0 - 46.0 %   MCV 93.3 78.0 - 100.0 fl   MCHC 33.7 30.0 - 36.0 g/dL   RDW 13.8 11.5 - 15.5 %  Comprehensive metabolic panel  Result Value Ref Range   Sodium 137 135 - 145 mEq/L   Potassium 4.4 3.5 - 5.1 mEq/L   Chloride 101 96 - 112 mEq/L   CO2 26 19 - 32 mEq/L   Glucose, Bld 93 70 - 99 mg/dL   BUN 22 6 - 23 mg/dL   Creatinine, Ser 1.03 0.40 - 1.20 mg/dL   Total Bilirubin 0.4 0.2 - 1.2 mg/dL   Alkaline Phosphatase 53 39 - 117 U/L   AST 29 0 - 37 U/L   ALT 29 0 - 35 U/L   Total Protein 6.9 6.0 - 8.3 g/dL   Albumin 4.5 3.5 - 5.2 g/dL   GFR 54.21 (L) >60.00 mL/min   Calcium 9.9 8.4 - 10.5 mg/dL  Hemoglobin A1c  Result Value Ref Range   Hgb A1c MFr Bld 6.0 4.6 - 6.5 %  Lipid panel  Result Value Ref Range   Cholesterol 231 (H) 0 - 200 mg/dL   Triglycerides 93.0 0.0 - 149.0 mg/dL   HDL 118.90 >39.00 mg/dL   VLDL 18.6 0.0 - 40.0 mg/dL   LDL Cholesterol 93 0 - 99 mg/dL   Total  CHOL/HDL Ratio 2    NonHDL 111.94   TSH  Result Value Ref Range   TSH 0.95 0.35 - 4.50 uIU/mL  VITAMIN D 25 Hydroxy (Vit-D Deficiency, Fractures)  Result Value Ref Range   VITD 36.43 30.00 - 100.00 ng/mL

## 2020-12-01 NOTE — Patient Instructions (Addendum)
Good to see you again today- I will be in touch with your labs asap Please do check on your BP at home a few times; if you continue to run higher than 140/85 please let me know and we can increase your amlodipine a bit    Health Maintenance After Age 73 After age 52, you are at a higher risk for certain long-term diseases and infections as well as injuries from falls. Falls are a major cause of broken bones and head injuries in people who are older than age 33. Getting regular preventive care can help to keep you healthy and well. Preventive care includes getting regular testing and making lifestyle changes as recommended by your health care provider. Talk with your health care provider about:  Which screenings and tests you should have. A screening is a test that checks for a disease when you have no symptoms.  A diet and exercise plan that is right for you. What should I know about screenings and tests to prevent falls? Screening and testing are the best ways to find a health problem early. Early diagnosis and treatment give you the best chance of managing medical conditions that are common after age 52. Certain conditions and lifestyle choices may make you more likely to have a fall. Your health care provider may recommend:  Regular vision checks. Poor vision and conditions such as cataracts can make you more likely to have a fall. If you wear glasses, make sure to get your prescription updated if your vision changes.  Medicine review. Work with your health care provider to regularly review all of the medicines you are taking, including over-the-counter medicines. Ask your health care provider about any side effects that may make you more likely to have a fall. Tell your health care provider if any medicines that you take make you feel dizzy or sleepy.  Osteoporosis screening. Osteoporosis is a condition that causes the bones to get weaker. This can make the bones weak and cause them to break  more easily.  Blood pressure screening. Blood pressure changes and medicines to control blood pressure can make you feel dizzy.  Strength and balance checks. Your health care provider may recommend certain tests to check your strength and balance while standing, walking, or changing positions.  Foot health exam. Foot pain and numbness, as well as not wearing proper footwear, can make you more likely to have a fall.  Depression screening. You may be more likely to have a fall if you have a fear of falling, feel emotionally low, or feel unable to do activities that you used to do.  Alcohol use screening. Using too much alcohol can affect your balance and may make you more likely to have a fall. What actions can I take to lower my risk of falls? General instructions  Talk with your health care provider about your risks for falling. Tell your health care provider if: ? You fall. Be sure to tell your health care provider about all falls, even ones that seem minor. ? You feel dizzy, sleepy, or off-balance.  Take over-the-counter and prescription medicines only as told by your health care provider. These include any supplements.  Eat a healthy diet and maintain a healthy weight. A healthy diet includes low-fat dairy products, low-fat (lean) meats, and fiber from whole grains, beans, and lots of fruits and vegetables. Home safety  Remove any tripping hazards, such as rugs, cords, and clutter.  Install safety equipment such as grab bars in bathrooms  and safety rails on stairs.  Keep rooms and walkways well-lit. Activity  Follow a regular exercise program to stay fit. This will help you maintain your balance. Ask your health care provider what types of exercise are appropriate for you.  If you need a cane or walker, use it as recommended by your health care provider.  Wear supportive shoes that have nonskid soles.   Lifestyle  Do not drink alcohol if your health care provider tells you not  to drink.  If you drink alcohol, limit how much you have: ? 0-1 drink a day for women. ? 0-2 drinks a day for men.  Be aware of how much alcohol is in your drink. In the [place] one drink equals one typical bottle of beer (12 oz), one-half glass of wine (5 oz), or one shot of hard liquor (1 oz).  Do not use any products that contain nicotine or tobacco, such as cigarettes and e-cigarettes. If you need help quitting, ask your health care provider. Summary  Having a healthy lifestyle and getting preventive care can help to protect your health and wellness after age 84.  Screening and testing are the best way to find a health problem early and help you avoid having a fall. Early diagnosis and treatment give you the best chance for managing medical conditions that are more common for people who are older than age 6.  Falls are a major cause of broken bones and head injuries in people who are older than age 31. Take precautions to prevent a fall at home.  Work with your health care provider to learn what changes you can make to improve your health and wellness and to prevent falls. This information is not intended to replace advice given to you by your health care provider. Make sure you discuss any questions you have with your health care provider. Document Revised: 01/19/2019 Document Reviewed: 08/11/2017 Elsevier Patient Education  2021 [employer].

## 2020-12-04 ENCOUNTER — Encounter: Payer: Self-pay | Admitting: Family Medicine

## 2020-12-04 ENCOUNTER — Ambulatory Visit (INDEPENDENT_AMBULATORY_CARE_PROVIDER_SITE_OTHER): Payer: Medicare Other | Admitting: Family Medicine

## 2020-12-04 ENCOUNTER — Other Ambulatory Visit: Payer: Self-pay

## 2020-12-04 VITALS — BP 152/82 | HR 87 | Resp 16 | Ht 60.25 in | Wt 132.0 lb

## 2020-12-04 DIAGNOSIS — Z13220 Encounter for screening for lipoid disorders: Secondary | ICD-10-CM

## 2020-12-04 DIAGNOSIS — R5383 Other fatigue: Secondary | ICD-10-CM | POA: Diagnosis not present

## 2020-12-04 DIAGNOSIS — R7303 Prediabetes: Secondary | ICD-10-CM

## 2020-12-04 DIAGNOSIS — Z130 Encounter for screening for diseases of the blood and blood-forming organs and certain disorders involving the immune mechanism: Secondary | ICD-10-CM | POA: Diagnosis not present

## 2020-12-04 DIAGNOSIS — I10 Essential (primary) hypertension: Secondary | ICD-10-CM | POA: Diagnosis not present

## 2020-12-04 DIAGNOSIS — Z1329 Encounter for screening for other suspected endocrine disorder: Secondary | ICD-10-CM | POA: Diagnosis not present

## 2020-12-04 DIAGNOSIS — E559 Vitamin D deficiency, unspecified: Secondary | ICD-10-CM

## 2020-12-05 ENCOUNTER — Encounter: Payer: Self-pay | Admitting: Family Medicine

## 2020-12-05 LAB — LIPID PANEL
Cholesterol: 231 mg/dL — ABNORMAL HIGH (ref 0–200)
HDL: 118.9 mg/dL (ref >39.00)
LDL Cholesterol: 93 mg/dL (ref 0–99)
NonHDL: 111.94
Total CHOL/HDL Ratio: 2
Triglycerides: 93 mg/dL (ref 0.0–149.0)
VLDL: 18.6 mg/dL (ref 0.0–40.0)

## 2020-12-05 LAB — COMPREHENSIVE METABOLIC PANEL
ALT: 29 U/L (ref 0–35)
AST: 29 U/L (ref 0–37)
Albumin: 4.5 g/dL (ref 3.5–5.2)
Alkaline Phosphatase: 53 U/L (ref 39–117)
BUN: 22 mg/dL (ref 6–23)
CO2: 26 mEq/L (ref 19–32)
Calcium: 9.9 mg/dL (ref 8.4–10.5)
Chloride: 101 mEq/L (ref 96–112)
Creatinine, Ser: 1.03 mg/dL (ref 0.40–1.20)
GFR: 54.21 mL/min — ABNORMAL LOW (ref >60.00)
Glucose, Bld: 93 mg/dL (ref 70–99)
Potassium: 4.4 mEq/L (ref 3.5–5.1)
Sodium: 137 mEq/L (ref 135–145)
Total Bilirubin: 0.4 mg/dL (ref 0.2–1.2)
Total Protein: 6.9 g/dL (ref 6.0–8.3)

## 2020-12-05 LAB — CBC
HCT: 40.5 % (ref 36.0–46.0)
Hemoglobin: 13.6 g/dL (ref 12.0–15.0)
MCHC: 33.7 g/dL (ref 30.0–36.0)
MCV: 93.3 fl (ref 78.0–100.0)
Platelets: 360 10*3/uL (ref 150.0–400.0)
RBC: 4.34 Mil/uL (ref 3.87–5.11)
RDW: 13.8 % (ref 11.5–15.5)
WBC: 7.7 10*3/uL (ref 4.0–10.5)

## 2020-12-05 LAB — VITAMIN D 25 HYDROXY (VIT D DEFICIENCY, FRACTURES): VITD: 36.43 ng/mL (ref 30.00–100.00)

## 2020-12-05 LAB — TSH: TSH: 0.95 u[IU]/mL (ref 0.35–4.50)

## 2020-12-05 LAB — HEMOGLOBIN A1C: Hgb A1c MFr Bld: 6 % (ref 4.6–6.5)

## 2020-12-09 DIAGNOSIS — M5416 Radiculopathy, lumbar region: Secondary | ICD-10-CM | POA: Diagnosis not present

## 2020-12-17 DIAGNOSIS — D485 Neoplasm of uncertain behavior of skin: Secondary | ICD-10-CM | POA: Diagnosis not present

## 2020-12-17 DIAGNOSIS — L080 Pyoderma: Secondary | ICD-10-CM | POA: Diagnosis not present

## 2020-12-31 DIAGNOSIS — M4126 Other idiopathic scoliosis, lumbar region: Secondary | ICD-10-CM | POA: Diagnosis not present

## 2020-12-31 DIAGNOSIS — M48061 Spinal stenosis, lumbar region without neurogenic claudication: Secondary | ICD-10-CM | POA: Diagnosis not present

## 2020-12-31 DIAGNOSIS — M545 Low back pain, unspecified: Secondary | ICD-10-CM | POA: Diagnosis not present

## 2020-12-31 DIAGNOSIS — M5416 Radiculopathy, lumbar region: Secondary | ICD-10-CM | POA: Diagnosis not present

## 2020-12-31 DIAGNOSIS — M4316 Spondylolisthesis, lumbar region: Secondary | ICD-10-CM | POA: Diagnosis not present

## 2020-12-31 DIAGNOSIS — I10 Essential (primary) hypertension: Secondary | ICD-10-CM | POA: Diagnosis not present

## 2021-01-08 DIAGNOSIS — D225 Melanocytic nevi of trunk: Secondary | ICD-10-CM | POA: Diagnosis not present

## 2021-01-08 DIAGNOSIS — D2371 Other benign neoplasm of skin of right lower limb, including hip: Secondary | ICD-10-CM | POA: Diagnosis not present

## 2021-01-08 DIAGNOSIS — Z85828 Personal history of other malignant neoplasm of skin: Secondary | ICD-10-CM | POA: Diagnosis not present

## 2021-01-08 DIAGNOSIS — L821 Other seborrheic keratosis: Secondary | ICD-10-CM | POA: Diagnosis not present

## 2021-01-08 DIAGNOSIS — D235 Other benign neoplasm of skin of trunk: Secondary | ICD-10-CM | POA: Diagnosis not present

## 2021-01-08 DIAGNOSIS — L570 Actinic keratosis: Secondary | ICD-10-CM | POA: Diagnosis not present

## 2021-01-08 DIAGNOSIS — L578 Other skin changes due to chronic exposure to nonionizing radiation: Secondary | ICD-10-CM | POA: Diagnosis not present

## 2021-01-08 DIAGNOSIS — L719 Rosacea, unspecified: Secondary | ICD-10-CM | POA: Diagnosis not present

## 2021-01-08 DIAGNOSIS — L0889 Other specified local infections of the skin and subcutaneous tissue: Secondary | ICD-10-CM | POA: Diagnosis not present

## 2021-02-16 ENCOUNTER — Other Ambulatory Visit: Payer: Self-pay | Admitting: Family Medicine

## 2021-02-16 DIAGNOSIS — I10 Essential (primary) hypertension: Secondary | ICD-10-CM

## 2021-03-13 DIAGNOSIS — M5416 Radiculopathy, lumbar region: Secondary | ICD-10-CM | POA: Diagnosis not present

## 2021-03-15 ENCOUNTER — Encounter: Payer: Self-pay | Admitting: Family Medicine

## 2021-03-15 DIAGNOSIS — E2839 Other primary ovarian failure: Secondary | ICD-10-CM

## 2021-03-15 DIAGNOSIS — M8580 Other specified disorders of bone density and structure, unspecified site: Secondary | ICD-10-CM

## 2021-03-31 ENCOUNTER — Ambulatory Visit (HOSPITAL_BASED_OUTPATIENT_CLINIC_OR_DEPARTMENT_OTHER): Payer: Medicare Other | Admitting: Obstetrics & Gynecology

## 2021-04-08 ENCOUNTER — Ambulatory Visit (HOSPITAL_BASED_OUTPATIENT_CLINIC_OR_DEPARTMENT_OTHER): Payer: Medicare Other | Admitting: Obstetrics & Gynecology

## 2021-04-08 NOTE — Progress Notes (Signed)
75 y.o. G2P2 Married White or Caucasian female here for breast and pelvic exam.  I am also following her for concerns about .  Denies vaginal bleeding.  Still having back issues.  Seeing Dr. Vertell Limber.  Having some steroid injections.  He ordered a BMD to make sure this is not affecting her bones.    Family hx of breast cancer in her mother.  Tyrer Cusick model done today showed 6% lifetime.    Patient's last menstrual period was 10/13/1999.           Health Maintenance: PCP:  Dr. Lorelei Pont.  Last wellness appt was 11/2020.  Did blood work at that appt:  11/2020  Vaccines are up to date:  yes Colonoscopy:  2018 MMG:  2021 BMD:  scheduled for this today Last pap smear:  03/2020   H/o abnormal pap smear:  no   reports that she has never smoked. She has never used smokeless tobacco. She reports current alcohol use of about 7.0 standard drinks of alcohol per week. She reports that she does not use drugs.  Past Medical History:  Diagnosis Date   Abnormal Pap smear of cervix    Anxiety    Chlamydia    h/o   Diverticulitis    Hypertension    Osteopenia    hip and spine   Renal cyst    stable CT 5/11 [place] 10/12   SCC (squamous cell carcinoma), arm, left 12/2017    Past Surgical History:  Procedure Laterality Date   GYNECOLOGIC CRYOSURGERY      Current Outpatient Medications  Medication Sig Dispense Refill   ALPRAZolam (XANAX) 0.5 MG tablet TAKE 1 TABLET AT BEDTIME AS NEEDED. 30 tablet 0   amLODipine (NORVASC) 2.5 MG tablet Take 1 tablet (2.5 mg total) by mouth daily. 90 tablet 1   B Complex Vitamins (VITAMIN B COMPLEX PO) Take by mouth daily.     CALCIUM PO Take by mouth. + magnesium and vitamin d daily     Multiple Vitamins-Minerals (MULTIVITAMIN PO) Take by mouth daily.     UNABLE TO FIND Mag R & R (muscle recovery & sleep support     ampicillin (PRINCIPEN) 500 MG capsule Take 500 mg by mouth daily. 5 x week (Patient not taking: Reported on 04/09/2021)     No current  facility-administered medications for this visit.    Family History  Problem Relation Age of Onset   Heart disease Mother    Breast cancer Mother 35       and stomach cancer   Mitral valve prolapse Mother    Heart disease Father    Prostate cancer Father    Depression Daughter     Review of Systems  All other systems reviewed and are negative.  Exam:   BP (!) 180/79 (BP Location: Right Arm, Patient Position: Sitting, Cuff Size: Small)   Pulse 68   Ht 5' 0.5" (1.537 m)   Wt 126 lb 6.4 oz (57.3 kg)   LMP 10/13/1999   BMI 24.28 kg/m   Height: 5' 0.5" (153.7 cm)  General appearance: alert, cooperative and appears stated age Breasts: normal appearance, no masses or tenderness Abdomen: soft, non-tender; bowel sounds normal; no masses,  no organomegaly Lymph nodes: Cervical, supraclavicular, and axillary nodes normal.  No abnormal inguinal nodes palpated Neurologic: Grossly normal  Pelvic: External genitalia:  no lesions              Urethra:  normal appearing urethra with no masses, tenderness  or lesions              Bartholins and Skenes: normal                 Vagina: normal appearing vagina with atrophic changes and no discharge, no lesions              Cervix: no lesions              Pap taken: No. Bimanual Exam:  Uterus:  normal size, contour, position, consistency, mobility, non-tender              Adnexa: normal adnexa and no mass, fullness, tenderness               Rectovaginal: Confirms               Anus:  normal sphincter tone, no lesions  Chaperone, Octaviano Batty, CMA, was present for exam.  Assessment/Plan: 1. GYN exam for high-risk Medicare patient - pap obtained last year.  Not indicated today. - will have MMG this summer - BMD scheduled for this evening - colonoscopy 2018, follow up 5 years - lab work is done with Dr. Lorelei Pont - vaccines up dated  2. Abdominal bloating - US PELVIS TRANSVAGINAL NON-OB (TV ONLY); Future  3. Osteopenia, unspecified  location - BMD scheduled this evening  4. History of cryosurgery  5. History of chlamydia -remote  6. Family history of breast cancer - in her mother in her 67's.

## 2021-04-09 ENCOUNTER — Other Ambulatory Visit: Payer: Self-pay

## 2021-04-09 ENCOUNTER — Encounter (HOSPITAL_BASED_OUTPATIENT_CLINIC_OR_DEPARTMENT_OTHER): Payer: Self-pay | Admitting: Obstetrics & Gynecology

## 2021-04-09 ENCOUNTER — Ambulatory Visit
Admission: RE | Admit: 2021-04-09 | Discharge: 2021-04-09 | Disposition: A | Discharge status: Home / Self Care | Payer: Medicare Other | Source: Ambulatory Visit | Attending: Family Medicine | Admitting: Family Medicine

## 2021-04-09 ENCOUNTER — Ambulatory Visit (INDEPENDENT_AMBULATORY_CARE_PROVIDER_SITE_OTHER): Payer: Medicare Other | Admitting: Obstetrics & Gynecology

## 2021-04-09 VITALS — BP 180/79 | HR 68 | Ht 60.5 in | Wt 126.4 lb

## 2021-04-09 DIAGNOSIS — M8580 Other specified disorders of bone density and structure, unspecified site: Secondary | ICD-10-CM

## 2021-04-09 DIAGNOSIS — E2839 Other primary ovarian failure: Secondary | ICD-10-CM

## 2021-04-09 DIAGNOSIS — Z9889 Other specified postprocedural states: Secondary | ICD-10-CM | POA: Diagnosis not present

## 2021-04-09 DIAGNOSIS — R140 Abdominal distension (gaseous): Secondary | ICD-10-CM

## 2021-04-09 DIAGNOSIS — Z8619 Personal history of other infectious and parasitic diseases: Secondary | ICD-10-CM

## 2021-04-09 DIAGNOSIS — Z9189 Other specified personal risk factors, not elsewhere classified: Secondary | ICD-10-CM

## 2021-04-09 DIAGNOSIS — Z803 Family history of malignant neoplasm of breast: Secondary | ICD-10-CM | POA: Diagnosis not present

## 2021-04-09 DIAGNOSIS — Z780 Asymptomatic menopausal state: Secondary | ICD-10-CM | POA: Diagnosis not present

## 2021-04-09 DIAGNOSIS — M8589 Other specified disorders of bone density and structure, multiple sites: Secondary | ICD-10-CM | POA: Diagnosis not present

## 2021-04-09 NOTE — Addendum Note (Signed)
Addended by: Megan Salon on: 04/09/2021 05:47 PM   Modules accepted: Orders

## 2021-04-10 ENCOUNTER — Encounter: Payer: Self-pay | Admitting: Family Medicine

## 2021-04-24 ENCOUNTER — Telehealth (HOSPITAL_BASED_OUTPATIENT_CLINIC_OR_DEPARTMENT_OTHER): Payer: Self-pay | Admitting: Obstetrics & Gynecology

## 2021-04-24 NOTE — Telephone Encounter (Signed)
Patient called and would like for the nurse call her back about her ultrasound order .

## 2021-04-30 DIAGNOSIS — R030 Elevated blood-pressure reading, without diagnosis of hypertension: Secondary | ICD-10-CM | POA: Diagnosis not present

## 2021-04-30 DIAGNOSIS — M545 Low back pain, unspecified: Secondary | ICD-10-CM | POA: Diagnosis not present

## 2021-04-30 DIAGNOSIS — M5416 Radiculopathy, lumbar region: Secondary | ICD-10-CM | POA: Diagnosis not present

## 2021-04-30 DIAGNOSIS — M4316 Spondylolisthesis, lumbar region: Secondary | ICD-10-CM | POA: Diagnosis not present

## 2021-04-30 DIAGNOSIS — M48061 Spinal stenosis, lumbar region without neurogenic claudication: Secondary | ICD-10-CM | POA: Diagnosis not present

## 2021-05-01 NOTE — Telephone Encounter (Signed)
Dr. Sabra Heck I don't see an order for an ultrasound. Please advise. tbw

## 2021-05-05 ENCOUNTER — Other Ambulatory Visit (HOSPITAL_BASED_OUTPATIENT_CLINIC_OR_DEPARTMENT_OTHER): Payer: Self-pay | Admitting: Obstetrics & Gynecology

## 2021-05-05 DIAGNOSIS — R1907 Generalized intra-abdominal and pelvic swelling, mass and lump: Secondary | ICD-10-CM

## 2021-05-05 DIAGNOSIS — R140 Abdominal distension (gaseous): Secondary | ICD-10-CM

## 2021-05-06 NOTE — Telephone Encounter (Signed)
LMOM at 10:51 for patient to call our office to receive the below message per Dr. Sabra Heck. tbw

## 2021-05-07 ENCOUNTER — Other Ambulatory Visit: Payer: Self-pay | Admitting: Obstetrics & Gynecology

## 2021-05-07 DIAGNOSIS — Z1231 Encounter for screening mammogram for malignant neoplasm of breast: Secondary | ICD-10-CM

## 2021-05-10 ENCOUNTER — Other Ambulatory Visit: Payer: Self-pay

## 2021-05-10 ENCOUNTER — Ambulatory Visit
Admission: RE | Admit: 2021-05-10 | Discharge: 2021-05-10 | Disposition: A | Discharge status: Home / Self Care | Payer: Medicare Other | Source: Ambulatory Visit | Attending: Obstetrics & Gynecology | Admitting: Obstetrics & Gynecology

## 2021-05-10 DIAGNOSIS — Z1231 Encounter for screening mammogram for malignant neoplasm of breast: Secondary | ICD-10-CM | POA: Diagnosis not present

## 2021-05-12 NOTE — Telephone Encounter (Signed)
Patient has been scheduled for 05/20/2021 for ultrasound. tbw

## 2021-05-13 DIAGNOSIS — M545 Low back pain, unspecified: Secondary | ICD-10-CM | POA: Diagnosis not present

## 2021-05-13 DIAGNOSIS — M5416 Radiculopathy, lumbar region: Secondary | ICD-10-CM | POA: Diagnosis not present

## 2021-05-20 ENCOUNTER — Other Ambulatory Visit: Payer: Self-pay

## 2021-05-20 ENCOUNTER — Ambulatory Visit (HOSPITAL_BASED_OUTPATIENT_CLINIC_OR_DEPARTMENT_OTHER)
Admission: RE | Admit: 2021-05-20 | Discharge: 2021-05-20 | Disposition: A | Discharge status: Home / Self Care | Payer: Medicare Other | Source: Ambulatory Visit | Attending: Obstetrics & Gynecology | Admitting: Obstetrics & Gynecology

## 2021-05-20 DIAGNOSIS — M48061 Spinal stenosis, lumbar region without neurogenic claudication: Secondary | ICD-10-CM | POA: Diagnosis not present

## 2021-05-20 DIAGNOSIS — I10 Essential (primary) hypertension: Secondary | ICD-10-CM | POA: Diagnosis not present

## 2021-05-20 DIAGNOSIS — R140 Abdominal distension (gaseous): Secondary | ICD-10-CM | POA: Insufficient documentation

## 2021-05-20 DIAGNOSIS — R1907 Generalized intra-abdominal and pelvic swelling, mass and lump: Secondary | ICD-10-CM | POA: Insufficient documentation

## 2021-05-20 DIAGNOSIS — M545 Low back pain, unspecified: Secondary | ICD-10-CM | POA: Diagnosis not present

## 2021-05-20 DIAGNOSIS — M5416 Radiculopathy, lumbar region: Secondary | ICD-10-CM | POA: Diagnosis not present

## 2021-05-20 DIAGNOSIS — M4126 Other idiopathic scoliosis, lumbar region: Secondary | ICD-10-CM | POA: Diagnosis not present

## 2021-05-20 DIAGNOSIS — N952 Postmenopausal atrophic vaginitis: Secondary | ICD-10-CM | POA: Diagnosis not present

## 2021-05-20 DIAGNOSIS — M4316 Spondylolisthesis, lumbar region: Secondary | ICD-10-CM | POA: Diagnosis not present

## 2021-05-27 ENCOUNTER — Other Ambulatory Visit: Payer: Self-pay | Admitting: Neurosurgery

## 2021-06-19 ENCOUNTER — Telehealth (HOSPITAL_BASED_OUTPATIENT_CLINIC_OR_DEPARTMENT_OTHER): Payer: Self-pay | Admitting: *Deleted

## 2021-06-19 NOTE — Progress Notes (Signed)
Surgical Instructions    Your procedure is scheduled on Thursday September 15.  Report to [facility] Main Entrance "A" at 8:00 A.M., then check in with the Admitting office.  Call this number if you have problems the morning of surgery:  [phone]   If you have any questions prior to your surgery date call [phone]: Open Monday-Friday 8am-4pm    Remember:  Do not eat after midnight the night before your surgery  You may drink clear liquids until 7am the morning of your surgery.   Clear liquids allowed are: Water, Non-Citrus Juices (without pulp), Carbonated Beverages, Clear Tea, Black Coffee ONLY (NO MILK, CREAM OR POWDERED CREAMER of any kind), and Gatorade    Take these medicines the morning of surgery with A SIP OF WATER :           amLODipine (NORVASC)            HYDROcodone-acetaminophen (NORCO/VICODIN)  As of today, STOP taking any Aspirin (unless otherwise instructed by your surgeon) Aleve, Naproxen, Ibuprofen, Motrin, Advil, Goody's, BC's, all herbal medications, fish oil, and all vitamins.          Do not wear jewelry or makeup Do not wear lotions, powders, perfumes/colognes, or deodorant. Do not shave 48 hours prior to surgery.  Men may shave face and neck. Do not bring valuables to the hospital. DO Not wear nail polish, gel polish, artificial nails, or any other type of covering on natural nails including finger and toenails. If patients have artificial nails, gel coating, etc. that need to be removed by a nail salon please have this removed prior to surgery or surgery may need to be canceled/delayed if the surgeon/ anesthesia feels like the patient is unable to be adequately monitored.             [facility] is not responsible for any belongings or valuables.  Do NOT Smoke (Tobacco/Vaping)  24 hours prior to your procedure If you use a CPAP at night, you may bring all equipment for your overnight stay.   Contacts, glasses, dentures or bridgework may not be worn  into surgery, please bring cases for these belongings   For patients admitted to the hospital, discharge time will be determined by your treatment team.   Patients discharged the day of surgery will not be allowed to drive home, and someone needs to stay with them for 24 hours.  ONLY 1 SUPPORT PERSON MAY BE PRESENT WHILE YOU ARE IN SURGERY. IF YOU ARE TO BE ADMITTED ONCE YOU ARE IN YOUR ROOM YOU WILL BE ALLOWED TWO (2) VISITORS.  Minor children may have two parents present. Special consideration for safety and communication needs will be reviewed on a case by case basis.  Special instructions:    Oral Hygiene is also important to reduce your risk of infection.  Remember - BRUSH YOUR TEETH THE MORNING OF SURGERY WITH YOUR REGULAR TOOTHPASTE   [facility]- Preparing For Surgery  Before surgery, you can play an important role. Because skin is not sterile, your skin needs to be as free of germs as possible. You can reduce the number of germs on your skin by washing with CHG (chlorahexidine gluconate) Soap before surgery.  CHG is an antiseptic cleaner which kills germs and bonds with the skin to continue killing germs even after washing.     Please do not use if you have an allergy to CHG or antibacterial soaps. If your skin becomes reddened/irritated stop using the CHG.  Do not shave (including legs and underarms) for at least 48 hours prior to first CHG shower. It is OK to shave your face.  Please follow these instructions carefully.     Shower the NIGHT BEFORE SURGERY and the MORNING OF SURGERY with CHG Soap.   If you chose to wash your hair, wash your hair first as usual with your normal shampoo. After you shampoo, rinse your hair and body thoroughly to remove the shampoo.  Then [employer] and genitals (private parts) with your normal soap and rinse thoroughly to remove soap.  After that Use CHG Soap as you would any other liquid soap. You can apply CHG directly to the skin and wash gently  with a scrungie or a clean washcloth.   Apply the CHG Soap to your body ONLY FROM THE NECK DOWN.  Do not use on open wounds or open sores. Avoid contact with your eyes, ears, mouth and genitals (private parts). Wash Face and genitals (private parts)  with your normal soap.   Wash thoroughly, paying special attention to the area where your surgery will be performed.  Thoroughly rinse your body with warm water from the neck down.  DO NOT shower/wash with your normal soap after using and rinsing off the CHG Soap.  Pat yourself dry with a CLEAN TOWEL.  Wear CLEAN PAJAMAS to bed the night before surgery  Place CLEAN SHEETS on your bed the night before your surgery  DO NOT SLEEP WITH PETS.   Day of Surgery:  Take a shower with CHG soap. Wear Clean/Comfortable clothing the morning of surgery Do not apply any deodorants/lotions.   Remember to brush your teeth WITH YOUR REGULAR TOOTHPASTE.   Please read over the following fact sheets that you were given.

## 2021-06-19 NOTE — Telephone Encounter (Signed)
Patient is asking if anything needs to be advised from her [place] on 05/20/21., Please advise Sharrie Rothman CMA

## 2021-06-20 ENCOUNTER — Encounter (HOSPITAL_COMMUNITY)
Admission: RE | Admit: 2021-06-20 | Discharge: 2021-06-20 | Disposition: A | Discharge status: Home / Self Care | Payer: Medicare Other | Source: Ambulatory Visit | Attending: Neurosurgery | Admitting: Neurosurgery

## 2021-06-20 ENCOUNTER — Other Ambulatory Visit: Payer: Self-pay

## 2021-06-20 ENCOUNTER — Encounter (HOSPITAL_BASED_OUTPATIENT_CLINIC_OR_DEPARTMENT_OTHER): Payer: Self-pay

## 2021-06-20 ENCOUNTER — Encounter (HOSPITAL_COMMUNITY): Payer: Self-pay

## 2021-06-20 DIAGNOSIS — Z79899 Other long term (current) drug therapy: Secondary | ICD-10-CM | POA: Insufficient documentation

## 2021-06-20 DIAGNOSIS — M4126 Other idiopathic scoliosis, lumbar region: Secondary | ICD-10-CM | POA: Diagnosis not present

## 2021-06-20 DIAGNOSIS — Z01818 Encounter for other preprocedural examination: Secondary | ICD-10-CM | POA: Diagnosis not present

## 2021-06-20 DIAGNOSIS — I10 Essential (primary) hypertension: Secondary | ICD-10-CM | POA: Diagnosis not present

## 2021-06-20 LAB — CBC
HCT: 44.4 % (ref 36.0–46.0)
Hemoglobin: 14.3 g/dL (ref 12.0–15.0)
MCH: 31 pg (ref 26.0–34.0)
MCHC: 32.2 g/dL (ref 30.0–36.0)
MCV: 96.3 fL (ref 80.0–100.0)
Platelets: 358 10*3/uL (ref 150–400)
RBC: 4.61 MIL/uL (ref 3.87–5.11)
RDW: 13.2 % (ref 11.5–15.5)
WBC: 6.1 10*3/uL (ref 4.0–10.5)
nRBC: 0 % (ref 0.0–0.2)

## 2021-06-20 LAB — TYPE AND SCREEN
ABO/RH(D): O POS
Antibody Screen: NEGATIVE

## 2021-06-20 LAB — SURGICAL PCR SCREEN
MRSA, PCR: NEGATIVE
Staphylococcus aureus: NEGATIVE

## 2021-06-20 LAB — BASIC METABOLIC PANEL
Anion gap: 11 (ref 5–15)
BUN: 16 mg/dL (ref 8–23)
CO2: 20 mmol/L — ABNORMAL LOW (ref 22–32)
Calcium: 9.7 mg/dL (ref 8.9–10.3)
Chloride: 104 mmol/L (ref 98–111)
Creatinine, Ser: 0.74 mg/dL (ref 0.44–1.00)
GFR, Estimated: >60 mL/min (ref >60)
Glucose, Bld: 127 mg/dL — ABNORMAL HIGH (ref 70–99)
Potassium: 4.1 mmol/L (ref 3.5–5.1)
Sodium: 135 mmol/L (ref 135–145)

## 2021-06-20 NOTE — Progress Notes (Signed)
PCP - Dr. Janett Billow Copland Cardiologist -   PPM/ICD - n/a Device Orders -  Rep Notified -   Chest x-ray - n/a EKG - 06/20/21 Stress Test - in 1970, no follow up required ECHO - patient denies Cardiac Cath - patient denies  Sleep Study - patient denies CPAP -   Fasting Blood Sugar - n/a Checks Blood Sugar _____ times a day  Blood Thinner Instructions: n/a Aspirin Instructions:   ERAS Protcol - clears until 0700am morning of surgery PRE-SURGERY Ensure or G2- none ordered  COVID TEST- Monday 06/23/21, quarantine and masking instructions given   Anesthesia review: n/a  Patient denies shortness of breath, fever, cough and chest pain at PAT appointment   All instructions explained to the patient, with a verbal understanding of the material. Patient agrees to go over the instructions while at home for a better understanding. Patient also instructed to self quarantine after being tested for COVID-19. The opportunity to ask questions was provided.

## 2021-06-23 ENCOUNTER — Other Ambulatory Visit (HOSPITAL_COMMUNITY)
Admission: RE | Admit: 2021-06-23 | Discharge: 2021-06-23 | Disposition: A | Discharge status: Home / Self Care | Payer: Medicare Other | Source: Ambulatory Visit | Attending: Neurosurgery | Admitting: Neurosurgery

## 2021-06-23 DIAGNOSIS — Z20822 Contact with and (suspected) exposure to covid-19: Secondary | ICD-10-CM | POA: Insufficient documentation

## 2021-06-23 DIAGNOSIS — Z01812 Encounter for preprocedural laboratory examination: Secondary | ICD-10-CM | POA: Insufficient documentation

## 2021-06-23 LAB — SARS CORONAVIRUS 2 (TAT 6-24 HRS): SARS Coronavirus 2: NEGATIVE

## 2021-06-23 NOTE — Progress Notes (Signed)
Anesthesia Chart Review:  Case: [id number] Date/Time: 06/26/21 0953   Procedures:      Left Lumbar 3-4, Lumbar 4-5 Anterior lateral lumbar interbody fusion with percutaneous pedicle screw fixation (Left)     LUMBAR PERCUTANEOUS PEDICLE SCREW 2 LEVEL     APPLICATION OF PULSE     APPLICATION OF CELL SAVER   Anesthesia type: General   Pre-op diagnosis: Idiopathic scoliosis of lumbar region   Location: [facility] OR ROOM 19 / [place] OR   Surgeons: Erline Levine, MD       DISCUSSION: Patient is a 75 year old female scheduled for the above procedure.   History includes never smoker, HTN, skin cancer (SCC), diverticulitis.  BP 188/86 at PAT. I don't see that it was rechecked. She is on amlodipine 2.5 mg daily. At 12/04/20 PCP visit with Dr. Lorelei Pont, BP 152/82 then and was doing home BP monitoring, "typically in the normal range." By notes, she was going yoga, Piliates, and playing tennis for exercise at that time but also seeing Dr. Vertell Limber for back pain and getting back injections but may require lumbar fusion in the near future. Per PAT RN, patient said BP readings tend to run high at doctor's visits, but better at home so was advised to continue to monitor home BP and contact PCP if readings remain elevated. Message left with Janett Billow at Dr. Melven Sartorius office regarding BP.  Preoperative COVID-19 test is scheduled for 06/23/21. She is to take amlodipine on the morning of surgery. She will get vitals on arrival. Anesthesia to evaluate on the day of surgery.   VS: BP (!) 188/86   Pulse 78   Temp 36.9 C (Oral)   Resp 18   Ht 5' 0.5" (1.537 m)   Wt 58.3 kg   LMP 10/13/1999   SpO2 99%   BMI 24.70 kg/m     PROVIDERS: Copland, Gay Filler, MD is PCP Hale Bogus, MD is GYN   LABS: Labs reviewed: Acceptable for surgery. (all labs ordered are listed, but only abnormal results are displayed)  Labs Reviewed  BASIC METABOLIC PANEL - Abnormal; Notable for the following components:      Result Value   CO2 20 (*)     Glucose, Bld 127 (*)    All other components within normal limits  SURGICAL PCR SCREEN  CBC  TYPE AND SCREEN    EKG: 06/20/21: Ventricular rate 57 bpm Sinus bradycardia with Premature supraventricular complexes Nonspecific ST abnormality Abnormal ECG No previous tracing Confirmed by Cristopher Peru [phone]) on 06/20/2021 9:57:24 PM   CV: Reported remote history of a stress test in 1970 that did not require any follow-up.   Past Medical History:  Diagnosis Date   Abnormal Pap smear of cervix    Anxiety    Chlamydia    h/o   Diverticulitis    Hypertension    Osteopenia    hip and spine   Renal cyst    stable CT 5/11 [place] 10/12   SCC (squamous cell carcinoma), arm, left; hand, left 12/2017    Past Surgical History:  Procedure Laterality Date   COLONOSCOPY     EYE SURGERY     GYNECOLOGIC CRYOSURGERY     WISDOM TOOTH EXTRACTION      MEDICATIONS:  ALPRAZolam (XANAX) 0.5 MG tablet   amLODipine (NORVASC) 2.5 MG tablet   B Complex Vitamins (VITAMIN B COMPLEX PO)   CALCIUM PO   diclofenac (VOLTAREN) 50 MG EC tablet   HYDROcodone-acetaminophen (NORCO/VICODIN) 5-325 MG tablet  Multiple Vitamins-Minerals (MULTIVITAMIN PO)   UNABLE TO FIND   No current facility-administered medications for this encounter.    Myra Gianotti, PA-C Surgical Short Stay/Anesthesiology [facility] Phone [phone] [facility] Phone [phone] 06/23/2021 11:31 AM

## 2021-06-23 NOTE — Anesthesia Preprocedure Evaluation (Addendum)
Anesthesia Evaluation  Patient identified by MRN, date of birth, ID band Patient awake    Reviewed: Allergy & Precautions, NPO status , Patient's Chart, lab work & pertinent test results  History of Anesthesia Complications Negative for: history of anesthetic complications  Airway Mallampati: II  TM Distance: >3 FB Neck ROM: Full    Dental  (+) Dental Advisory Given, Teeth Intact   Pulmonary neg pulmonary ROS,    Pulmonary exam normal        Cardiovascular hypertension, Pt. on medications Normal cardiovascular exam     Neuro/Psych PSYCHIATRIC DISORDERS Anxiety negative neurological ROS     GI/Hepatic negative GI ROS, Neg liver ROS,   Endo/Other   Pre-DM   Renal/GU negative Renal ROS     Musculoskeletal negative musculoskeletal ROS (+)   Abdominal   Peds  Hematology negative hematology ROS (+)   Anesthesia Other Findings   Reproductive/Obstetrics                           Anesthesia Physical Anesthesia Plan  ASA: 3  Anesthesia Plan: General   Post-op Pain Management:    Induction: Intravenous  PONV Risk Score and Plan: 4 or greater and Treatment may vary due to age or medical condition, Ondansetron and Dexamethasone  Airway Management Planned: Oral ETT  Additional Equipment: None  Intra-op Plan:   Post-operative Plan: Extubation in OR  Informed Consent: I have reviewed the patients History and Physical, chart, labs and discussed the procedure including the risks, benefits and alternatives for the proposed anesthesia with the patient or authorized representative who has indicated his/her understanding and acceptance.     Dental advisory given  Plan Discussed with: CRNA and Anesthesiologist  Anesthesia Plan Comments:       Anesthesia Quick Evaluation

## 2021-06-24 NOTE — H&P (Signed)
Patient ID:   [id number] Patient: Tammy Leonard  Date of Birth: [birth year] Visit Type: Office Visit   Date: 05/20/2021 03:45 PM Provider: Marchia Meiers. Vertell Limber MD   This 75 year old female presents for back pain.  HISTORY OF PRESENT ILLNESS: 1.  back pain  Patient returns to review her MRI  The patient says that she is now doing much worse than she was previously.  She describes her pain level as 9/10 in severity.  She is not able to walk let alone play golf for play tennis.  She cannot stand up straight without significant back and bilateral lower extremity pain.  She gets some relief with sitting.  Bone density testing shows her bone density currently at-1.6.  This is 4/19 when it was negative 1.1.  This is still acceptable.  Her repeat MRI shows worsening of the spinal axis at the L3-4 level with spondylolisthesis at L3-4 and L4-5 levels.  There is worsening of foraminal stenosis at both of these levels.  The remaining levels appear satisfactory  My recommendation to the patient is based on her worsening pain and limitations as well as worsening imaging findings that we proceed with surgical intervention.  This will consist of left-sided L3-4 and L4-5 XL IF procedure with percutaneous pedicle screw fixation.  Risks and benefits were discussed in detail with the patient and patient education was performed.  She wishes to proceed with surgery.  I have given her a prescription for [place] for pain relief as her milder pain medicines are no longer working.      Medical/Surgical/Interim History Reviewed, no change.  Last detailed document date:03/04/2020.     PAST MEDICAL HISTORY, SURGICAL HISTORY, FAMILY HISTORY, SOCIAL HISTORY AND REVIEW OF SYSTEMS I have reviewed the patient's past medical, surgical, family and social history as well as the comprehensive review of systems as included on the [facility] history form dated 12/31/2020, which I have  signed.  Family History: Reviewed, no changes.    Social History: Reviewed, no changes.   MEDICATIONS: (added, continued or stopped this visit) Started Medication Directions Instruction Stopped  amlodipine 2.5 mg tablet     B COMPLEX NUMBER 1     calcium/magnesium    05/02/2021 diclofenac sodium 50 mg tablet,delayed release take 1 tablet by oral route 2 times every day   05/20/2021 hydrocodone 5 mg-acetaminophen 325 mg tablet take 1 tablet by oral route  every 6 hours as needed for pain   05/20/2021 hydrocodone 5 mg-acetaminophen 325 mg tablet take 1 tablet by oral route  every 6 hours as needed for pain    multivitamin tablet    04/30/2021 tramadol 50 mg tablet take 1 tablet by oral route  every 6 hours as needed      ALLERGIES: Ingredient Reaction Medication Name Comment PROCHLORPERAZINE EDISYLATE  Compazine  PROCHLORPERAZINE  Compazine  PROCHLORPERAZINE MALEATE  Compazine   Reviewed, no changes.    PHYSICAL EXAM:  Vitals Date Temp F BP Pulse Ht In Wt Lb BMI BSA Pain Score 05/20/2021  213/87 71 61 129 24.37  9/10     IMPRESSION:  The patient has worsening spinal stenosis and significant nerve compression.  Her spondylolisthesis at L3-4 and L4-5 are also worse.  PLAN: Proceed with decompression and fusion surgery, left-sided XL IF L3-4 and L4-5 levels with percutaneous pedicle screw fixation.  Orders: Diagnostic Procedures: Assessment Procedure M54.16 Lumbar Spine- AP/Lat Instruction(s)/Education: Assessment Instruction I10 Lifestyle education Miscellaneous: Assessment  M41.26 LSO Brace  Completed Orders (this encounter) Order Details Reason Side Interpretation Result Initial Treatment Date Region Lifestyle education Patient will follow up with Primary Care Physician.        Assessment/Plan  # Detail Type Description  1. Assessment Low back pain, unspecified back  pain laterality, unspecified chronicity, unspecified whether sciatica present (M54.50).     2. Assessment Radiculopathy, lumbar region (M54.16).     3. Assessment Spondylolisthesis, lumbar region (M43.16).     4. Assessment Idiopathic scoliosis of lumbar region (M41.26).  Plan Orders LSO Brace.     5. Assessment Foraminal stenosis of lumbar region (M48.061).     6. Assessment Essential (primary) hypertension (I10).       Pain Management Plan Pain Scale: 9/10. Method: Numeric Pain Intensity Scale. Location: back. Onset: 07/31/2019. Duration: varies. Quality: discomforting. Pain management follow-up plan of care: Patient will continue medication management.Marland Kitchen     MEDICATIONS PRESCRIBED TODAY    Rx Quantity Refills HYDROCODONE-ACETAMINOPHEN 5 mg-325 mg  60 0 HYDROCODONE-ACETAMINOPHEN 5 mg-325 mg  30 0           Provider:  Marchia Meiers. Vertell Limber MD  05/24/2021 01:59 PM    Dictation edited by: Marchia Meiers. Vertell Limber    CC Providers: [place],  [place]  [id number]-   [address]  [facility] [place], [place] [id number]-               Electronically signed by Marchia Meiers. Vertell Limber MD on 05/24/2021 01:59 PM

## 2021-06-26 ENCOUNTER — Inpatient Hospital Stay (HOSPITAL_COMMUNITY): Payer: Medicare Other

## 2021-06-26 ENCOUNTER — Inpatient Hospital Stay (HOSPITAL_COMMUNITY): Payer: Medicare Other | Admitting: Vascular Surgery

## 2021-06-26 ENCOUNTER — Inpatient Hospital Stay (HOSPITAL_COMMUNITY)
Admission: RE | Admit: 2021-06-26 | Discharge: 2021-06-27 | DRG: 455 | Disposition: A | Discharge status: Home / Self Care | Payer: Medicare Other | Attending: Neurosurgery | Admitting: Neurosurgery

## 2021-06-26 ENCOUNTER — Encounter (HOSPITAL_COMMUNITY)
Admission: RE | Disposition: A | Discharge status: Home / Self Care | Payer: Self-pay | Source: Home / Self Care | Attending: Neurosurgery

## 2021-06-26 ENCOUNTER — Other Ambulatory Visit (HOSPITAL_COMMUNITY): Payer: Self-pay | Admitting: Neurosurgery

## 2021-06-26 ENCOUNTER — Inpatient Hospital Stay (HOSPITAL_COMMUNITY): Payer: Medicare Other | Admitting: Anesthesiology

## 2021-06-26 DIAGNOSIS — Z20822 Contact with and (suspected) exposure to covid-19: Secondary | ICD-10-CM | POA: Diagnosis present

## 2021-06-26 DIAGNOSIS — M48061 Spinal stenosis, lumbar region without neurogenic claudication: Secondary | ICD-10-CM | POA: Diagnosis present

## 2021-06-26 DIAGNOSIS — M4316 Spondylolisthesis, lumbar region: Secondary | ICD-10-CM | POA: Diagnosis present

## 2021-06-26 DIAGNOSIS — R030 Elevated blood-pressure reading, without diagnosis of hypertension: Secondary | ICD-10-CM | POA: Diagnosis not present

## 2021-06-26 DIAGNOSIS — M4306 Spondylolysis, lumbar region: Secondary | ICD-10-CM | POA: Diagnosis not present

## 2021-06-26 DIAGNOSIS — I10 Essential (primary) hypertension: Secondary | ICD-10-CM | POA: Diagnosis present

## 2021-06-26 DIAGNOSIS — Z981 Arthrodesis status: Secondary | ICD-10-CM | POA: Diagnosis not present

## 2021-06-26 DIAGNOSIS — Z888 Allergy status to other drugs, medicaments and biological substances status: Secondary | ICD-10-CM

## 2021-06-26 DIAGNOSIS — F419 Anxiety disorder, unspecified: Secondary | ICD-10-CM | POA: Diagnosis not present

## 2021-06-26 DIAGNOSIS — M4126 Other idiopathic scoliosis, lumbar region: Secondary | ICD-10-CM | POA: Diagnosis present

## 2021-06-26 DIAGNOSIS — M4326 Fusion of spine, lumbar region: Secondary | ICD-10-CM | POA: Diagnosis not present

## 2021-06-26 DIAGNOSIS — M5416 Radiculopathy, lumbar region: Secondary | ICD-10-CM | POA: Diagnosis present

## 2021-06-26 DIAGNOSIS — Z419 Encounter for procedure for purposes other than remedying health state, unspecified: Secondary | ICD-10-CM

## 2021-06-26 DIAGNOSIS — R7303 Prediabetes: Secondary | ICD-10-CM | POA: Diagnosis not present

## 2021-06-26 HISTORY — PX: LUMBAR PERCUTANEOUS PEDICLE SCREW 2 LEVEL: SHX5561

## 2021-06-26 HISTORY — PX: ANTERIOR LAT LUMBAR FUSION: SHX1168

## 2021-06-26 LAB — ABO/RH: ABO/RH(D): O POS

## 2021-06-26 SURGERY — ANTERIOR LATERAL LUMBAR FUSION 2 LEVELS
Anesthesia: General | Site: Flank

## 2021-06-26 MED ORDER — LIDOCAINE 2% (20 MG/ML) 5 ML SYRINGE
INTRAMUSCULAR | Status: DC | PRN
  Administered 2021-06-26: 40 mg via INTRAVENOUS

## 2021-06-26 MED ORDER — FENTANYL CITRATE (PF) 250 MCG/5ML IJ SOLN
INTRAMUSCULAR | Status: AC
  Filled 2021-06-26: qty 5

## 2021-06-26 MED ORDER — METHOCARBAMOL 500 MG PO TABS
500.0000 mg | ORAL_TABLET | Freq: Four times a day (QID) | ORAL | Status: DC | PRN
  Administered 2021-06-26 – 2021-06-27 (×3): 500 mg via ORAL
  Filled 2021-06-26 (×3): qty 1

## 2021-06-26 MED ORDER — FENTANYL CITRATE (PF) 100 MCG/2ML IJ SOLN
25.0000 ug | INTRAMUSCULAR | Status: DC | PRN
  Administered 2021-06-26 (×2): 50 ug via INTRAVENOUS

## 2021-06-26 MED ORDER — PHENOL 1.4 % MT LIQD: 1.0000 | OROMUCOSAL | Status: DC | PRN

## 2021-06-26 MED ORDER — CHLORHEXIDINE GLUCONATE 0.12 % MT SOLN: 15.0000 mL | Freq: Once | OROMUCOSAL | Status: AC

## 2021-06-26 MED ORDER — ONDANSETRON HCL 4 MG/2ML IJ SOLN: 4.0000 mg | Freq: Four times a day (QID) | INTRAMUSCULAR | Status: DC | PRN

## 2021-06-26 MED ORDER — HYDROXYZINE HCL 50 MG/ML IM SOLN
50.0000 mg | Freq: Four times a day (QID) | INTRAMUSCULAR | Status: DC | PRN
  Filled 2021-06-26: qty 1

## 2021-06-26 MED ORDER — PANTOPRAZOLE SODIUM 40 MG IV SOLR: 40.0000 mg | Freq: Every day | INTRAVENOUS | Status: DC

## 2021-06-26 MED ORDER — ORAL CARE MOUTH RINSE: 15.0000 mL | Freq: Once | OROMUCOSAL | Status: AC

## 2021-06-26 MED ORDER — CHLORHEXIDINE GLUCONATE CLOTH 2 % EX PADS: 6.0000 | MEDICATED_PAD | Freq: Once | CUTANEOUS | Status: DC

## 2021-06-26 MED ORDER — AMLODIPINE BESYLATE 5 MG PO TABS
2.5000 mg | ORAL_TABLET | Freq: Every day | ORAL | Status: DC
  Administered 2021-06-27: 2.5 mg via ORAL
  Filled 2021-06-26: qty 1

## 2021-06-26 MED ORDER — FLEET ENEMA 7-19 GM/118ML RE ENEM: 1.0000 | ENEMA | Freq: Once | RECTAL | Status: DC | PRN

## 2021-06-26 MED ORDER — BUPIVACAINE HCL (PF) 0.5 % IJ SOLN
INTRAMUSCULAR | Status: AC
  Filled 2021-06-26: qty 30

## 2021-06-26 MED ORDER — CEFAZOLIN SODIUM-DEXTROSE 2-4 GM/100ML-% IV SOLN
2.0000 g | Freq: Three times a day (TID) | INTRAVENOUS | Status: AC
  Administered 2021-06-26 – 2021-06-27 (×2): 2 g via INTRAVENOUS
  Filled 2021-06-26 (×2): qty 100

## 2021-06-26 MED ORDER — DICLOFENAC SODIUM 25 MG PO TBEC
50.0000 mg | DELAYED_RELEASE_TABLET | Freq: Two times a day (BID) | ORAL | Status: DC
  Administered 2021-06-27: 50 mg via ORAL
  Filled 2021-06-26: qty 2
  Filled 2021-06-26: qty 1
  Filled 2021-06-26: qty 2
  Filled 2021-06-26: qty 1

## 2021-06-26 MED ORDER — ALPRAZOLAM 0.5 MG PO TABS: 0.5000 mg | ORAL_TABLET | Freq: Every evening | ORAL | Status: DC | PRN

## 2021-06-26 MED ORDER — DIAZEPAM 5 MG/ML IJ SOLN
2.5000 mg | Freq: Once | INTRAMUSCULAR | Status: AC
  Administered 2021-06-26: 2.5 mg via INTRAVENOUS

## 2021-06-26 MED ORDER — SODIUM CHLORIDE 0.9% FLUSH: 3.0000 mL | Freq: Two times a day (BID) | INTRAVENOUS | Status: DC

## 2021-06-26 MED ORDER — VITAMIN B COMPLEX PO TABS: ORAL_TABLET | Freq: Every day | ORAL | Status: DC

## 2021-06-26 MED ORDER — ONDANSETRON HCL 4 MG/2ML IJ SOLN
INTRAMUSCULAR | Status: AC
  Filled 2021-06-26: qty 2

## 2021-06-26 MED ORDER — PANTOPRAZOLE SODIUM 40 MG PO TBEC
40.0000 mg | DELAYED_RELEASE_TABLET | Freq: Every day | ORAL | Status: DC
  Administered 2021-06-26: 40 mg via ORAL
  Filled 2021-06-26: qty 1

## 2021-06-26 MED ORDER — SUCCINYLCHOLINE CHLORIDE 200 MG/10ML IV SOSY
PREFILLED_SYRINGE | INTRAVENOUS | Status: DC | PRN
  Administered 2021-06-26: 100 mg via INTRAVENOUS

## 2021-06-26 MED ORDER — THROMBIN 5000 UNITS EX SOLR: OROMUCOSAL | Status: DC | PRN

## 2021-06-26 MED ORDER — POLYETHYLENE GLYCOL 3350 17 G PO PACK: 17.0000 g | PACK | Freq: Every day | ORAL | Status: DC | PRN

## 2021-06-26 MED ORDER — METHOCARBAMOL 1000 MG/10ML IJ SOLN
500.0000 mg | Freq: Four times a day (QID) | INTRAVENOUS | Status: DC | PRN
  Filled 2021-06-26: qty 5

## 2021-06-26 MED ORDER — CEFAZOLIN SODIUM-DEXTROSE 2-4 GM/100ML-% IV SOLN
INTRAVENOUS | Status: AC
  Filled 2021-06-26: qty 100

## 2021-06-26 MED ORDER — CHLORHEXIDINE GLUCONATE 0.12 % MT SOLN
OROMUCOSAL | Status: AC
  Administered 2021-06-26: 15 mL via OROMUCOSAL
  Filled 2021-06-26: qty 15

## 2021-06-26 MED ORDER — OXYCODONE HCL 5 MG PO TABS: 5.0000 mg | ORAL_TABLET | Freq: Once | ORAL | Status: DC | PRN

## 2021-06-26 MED ORDER — LIDOCAINE-EPINEPHRINE 1 %-1:100000 IJ SOLN
INTRAMUSCULAR | Status: AC
  Filled 2021-06-26: qty 1

## 2021-06-26 MED ORDER — ONDANSETRON HCL 4 MG/2ML IJ SOLN
INTRAMUSCULAR | Status: DC | PRN
  Administered 2021-06-26: 4 mg via INTRAVENOUS

## 2021-06-26 MED ORDER — HYDROMORPHONE HCL 1 MG/ML IJ SOLN
INTRAMUSCULAR | Status: AC
  Filled 2021-06-26: qty 1

## 2021-06-26 MED ORDER — LIDOCAINE-EPINEPHRINE 1 %-1:100000 IJ SOLN
INTRAMUSCULAR | Status: DC | PRN
  Administered 2021-06-26: 4 mL

## 2021-06-26 MED ORDER — OXYCODONE HCL 5 MG/5ML PO SOLN
5.0000 mg | Freq: Once | ORAL | Status: DC | PRN
Start: 2021-06-26 — End: 2021-06-26

## 2021-06-26 MED ORDER — FENTANYL CITRATE (PF) 250 MCG/5ML IJ SOLN
INTRAMUSCULAR | Status: DC | PRN
  Administered 2021-06-26 (×2): 50 ug via INTRAVENOUS
  Administered 2021-06-26 (×4): 25 ug via INTRAVENOUS
  Administered 2021-06-26: 50 ug via INTRAVENOUS

## 2021-06-26 MED ORDER — ONDANSETRON HCL 4 MG/2ML IJ SOLN
4.0000 mg | Freq: Once | INTRAMUSCULAR | Status: AC | PRN
  Administered 2021-06-26: 4 mg via INTRAVENOUS

## 2021-06-26 MED ORDER — KCL IN DEXTROSE-NACL 20-5-0.45 MEQ/L-%-% IV SOLN: INTRAVENOUS | Status: DC

## 2021-06-26 MED ORDER — CEFAZOLIN SODIUM-DEXTROSE 2-4 GM/100ML-% IV SOLN
2.0000 g | INTRAVENOUS | Status: AC
  Administered 2021-06-26: 2 g via INTRAVENOUS

## 2021-06-26 MED ORDER — SODIUM CHLORIDE 0.9% FLUSH: 3.0000 mL | INTRAVENOUS | Status: DC | PRN

## 2021-06-26 MED ORDER — THROMBIN 5000 UNITS EX SOLR
CUTANEOUS | Status: AC
  Filled 2021-06-26: qty 5000

## 2021-06-26 MED ORDER — B COMPLEX-C PO TABS
1.0000 | ORAL_TABLET | Freq: Every day | ORAL | Status: DC
  Filled 2021-06-26: qty 1

## 2021-06-26 MED ORDER — PROPOFOL 10 MG/ML IV BOLUS
INTRAVENOUS | Status: DC | PRN
  Administered 2021-06-26: 30 mg via INTRAVENOUS
  Administered 2021-06-26: 50 mg via INTRAVENOUS
  Administered 2021-06-26: 120 mg via INTRAVENOUS

## 2021-06-26 MED ORDER — DEXAMETHASONE SODIUM PHOSPHATE 10 MG/ML IJ SOLN
INTRAMUSCULAR | Status: DC | PRN
  Administered 2021-06-26: 10 mg via INTRAVENOUS

## 2021-06-26 MED ORDER — ZOLPIDEM TARTRATE 5 MG PO TABS: 5.0000 mg | ORAL_TABLET | Freq: Every evening | ORAL | Status: DC | PRN

## 2021-06-26 MED ORDER — BUPIVACAINE LIPOSOME 1.3 % IJ SUSP
INTRAMUSCULAR | Status: DC | PRN
  Administered 2021-06-26: 20 mL

## 2021-06-26 MED ORDER — SODIUM CHLORIDE 0.9 % IV SOLN: 250.0000 mL | INTRAVENOUS | Status: DC

## 2021-06-26 MED ORDER — BUPIVACAINE HCL (PF) 0.5 % IJ SOLN
INTRAMUSCULAR | Status: DC | PRN
  Administered 2021-06-26: 4 mL

## 2021-06-26 MED ORDER — MENTHOL 3 MG MT LOZG: 1.0000 | LOZENGE | OROMUCOSAL | Status: DC | PRN

## 2021-06-26 MED ORDER — HYDROMORPHONE HCL 1 MG/ML IJ SOLN
0.5000 mg | INTRAMUSCULAR | Status: DC | PRN
  Administered 2021-06-26: 0.5 mg via INTRAVENOUS
  Filled 2021-06-26: qty 0.5

## 2021-06-26 MED ORDER — ALUM & MAG HYDROXIDE-SIMETH 200-200-20 MG/5ML PO SUSP: 30.0000 mL | Freq: Four times a day (QID) | ORAL | Status: DC | PRN

## 2021-06-26 MED ORDER — PHENYLEPHRINE HCL-NACL 20-0.9 MG/250ML-% IV SOLN
INTRAVENOUS | Status: DC | PRN
  Administered 2021-06-26: 20 ug/min via INTRAVENOUS

## 2021-06-26 MED ORDER — FENTANYL CITRATE (PF) 100 MCG/2ML IJ SOLN
INTRAMUSCULAR | Status: AC
  Filled 2021-06-26: qty 2

## 2021-06-26 MED ORDER — BUPIVACAINE LIPOSOME 1.3 % IJ SUSP
INTRAMUSCULAR | Status: AC
  Filled 2021-06-26: qty 20

## 2021-06-26 MED ORDER — ACETAMINOPHEN 325 MG PO TABS
650.0000 mg | ORAL_TABLET | ORAL | Status: DC | PRN
  Administered 2021-06-26: 650 mg via ORAL
  Filled 2021-06-26: qty 2

## 2021-06-26 MED ORDER — HYDROMORPHONE HCL 1 MG/ML IJ SOLN
0.2500 mg | INTRAMUSCULAR | Status: DC | PRN
  Administered 2021-06-26 (×2): 0.5 mg via INTRAVENOUS

## 2021-06-26 MED ORDER — OXYCODONE HCL 5 MG PO TABS: 5.0000 mg | ORAL_TABLET | ORAL | Status: DC | PRN

## 2021-06-26 MED ORDER — DOCUSATE SODIUM 100 MG PO CAPS
100.0000 mg | ORAL_CAPSULE | Freq: Two times a day (BID) | ORAL | Status: DC
  Administered 2021-06-26 – 2021-06-27 (×2): 100 mg via ORAL
  Filled 2021-06-26 (×2): qty 1

## 2021-06-26 MED ORDER — GLYCOPYRROLATE PF 0.2 MG/ML IJ SOSY
PREFILLED_SYRINGE | INTRAMUSCULAR | Status: DC | PRN
  Administered 2021-06-26: .2 mg via INTRAVENOUS

## 2021-06-26 MED ORDER — ACETAMINOPHEN 650 MG RE SUPP: 650.0000 mg | RECTAL | Status: DC | PRN

## 2021-06-26 MED ORDER — BISACODYL 10 MG RE SUPP: 10.0000 mg | Freq: Every day | RECTAL | Status: DC | PRN

## 2021-06-26 MED ORDER — 0.9 % SODIUM CHLORIDE (POUR BTL) OPTIME
TOPICAL | Status: DC | PRN
  Administered 2021-06-26: 1000 mL

## 2021-06-26 MED ORDER — ONDANSETRON HCL 4 MG PO TABS
4.0000 mg | ORAL_TABLET | Freq: Four times a day (QID) | ORAL | Status: DC | PRN
Start: 2021-06-26 — End: 2021-06-27
  Administered 2021-06-27: 4 mg via ORAL
  Filled 2021-06-26: qty 1

## 2021-06-26 MED ORDER — HYDROCODONE-ACETAMINOPHEN 5-325 MG PO TABS
1.0000 | ORAL_TABLET | ORAL | Status: DC | PRN
  Administered 2021-06-26: 2 via ORAL
  Filled 2021-06-26: qty 2

## 2021-06-26 MED ORDER — LACTATED RINGERS IV SOLN: INTRAVENOUS | Status: DC

## 2021-06-26 MED ORDER — EPHEDRINE SULFATE-NACL 50-0.9 MG/10ML-% IV SOSY
PREFILLED_SYRINGE | INTRAVENOUS | Status: DC | PRN
  Administered 2021-06-26: 5 mg via INTRAVENOUS

## 2021-06-26 MED ORDER — DIAZEPAM 5 MG/ML IJ SOLN
INTRAMUSCULAR | Status: AC
  Filled 2021-06-26: qty 2

## 2021-06-26 MED ORDER — OXYCODONE HCL 5 MG PO TABS
5.0000 mg | ORAL_TABLET | ORAL | Status: DC | PRN
  Administered 2021-06-26: 5 mg via ORAL
  Administered 2021-06-26 – 2021-06-27 (×5): 10 mg via ORAL
  Filled 2021-06-26 (×6): qty 2

## 2021-06-26 SURGICAL SUPPLY — 81 items
ADH SKN CLS APL DERMABOND .7 (GAUZE/BANDAGES/DRESSINGS) ×6
BAG COUNTER SPONGE SURGICOUNT (BAG) ×8 IMPLANT
BAG SPNG CNTER NS LX DISP (BAG) ×6
BLADE CLIPPER SURG (BLADE) IMPLANT
CAGE MODULUS XLW 8X22X50 - 10 (Cage) ×1 IMPLANT
CARTRIDGE OIL MAESTRO DRILL (MISCELLANEOUS) ×3 IMPLANT
CLIP PULSE STIMULATION (NEUROSURGERY SUPPLIES) ×2 IMPLANT
CNTNR URN SCR LID CUP LEK RST (MISCELLANEOUS) ×3 IMPLANT
CONT SPEC 4OZ STRL OR WHT (MISCELLANEOUS) ×4
COVER BACK TABLE 60X90IN (DRAPES) ×4 IMPLANT
DECANTER SPIKE VIAL GLASS SM (MISCELLANEOUS) ×8 IMPLANT
DERMABOND ADVANCED (GAUZE/BANDAGES/DRESSINGS) ×2
DERMABOND ADVANCED .7 DNX12 (GAUZE/BANDAGES/DRESSINGS) ×6 IMPLANT
DIFFUSER DRILL AIR PNEUMATIC (MISCELLANEOUS) ×4 IMPLANT
DRAPE C-ARM 42X72 X-RAY (DRAPES) ×8 IMPLANT
DRAPE C-ARMOR (DRAPES) ×8 IMPLANT
DRAPE LAPAROTOMY 100X72X124 (DRAPES) ×8 IMPLANT
DRAPE PULSE TABLE ARRAY (DRAPES) IMPLANT
DRAPE SURG 17X23 STRL (DRAPES) ×4 IMPLANT
DURAPREP 26ML APPLICATOR (WOUND CARE) ×8 IMPLANT
ELECT REM PT RETURN 9FT ADLT (ELECTROSURGICAL) ×8
ELECTRODE REM PT RTRN 9FT ADLT (ELECTROSURGICAL) ×6 IMPLANT
GAUZE 4X4 16PLY ~~LOC~~+RFID DBL (SPONGE) IMPLANT
GAUZE SPONGE 4X4 12PLY STRL (GAUZE/BANDAGES/DRESSINGS) ×4 IMPLANT
GLOVE EXAM NITRILE XL STR (GLOVE) IMPLANT
GLOVE SRG 8 PF TXTR STRL LF DI (GLOVE) ×6 IMPLANT
GLOVE SURG ENC MOIS LTX SZ8 (GLOVE) ×12 IMPLANT
GLOVE SURG LTX SZ8 (GLOVE) ×8 IMPLANT
GLOVE SURG UNDER POLY LF SZ8 (GLOVE) ×8
GLOVE SURG UNDER POLY LF SZ8.5 (GLOVE) ×12 IMPLANT
GOWN STRL REUS W/ TWL LRG LVL3 (GOWN DISPOSABLE) IMPLANT
GOWN STRL REUS W/ TWL XL LVL3 (GOWN DISPOSABLE) ×9 IMPLANT
GOWN STRL REUS W/TWL 2XL LVL3 (GOWN DISPOSABLE) ×4 IMPLANT
GOWN STRL REUS W/TWL LRG LVL3 (GOWN DISPOSABLE)
GOWN STRL REUS W/TWL XL LVL3 (GOWN DISPOSABLE) ×12
GUIDEWIRE NITINOL BEVEL TIP (WIRE) ×6 IMPLANT
HEMOSTAT POWDER KIT SURGIFOAM (HEMOSTASIS) ×4 IMPLANT
KIT BASIN OR (CUSTOM PROCEDURE TRAY) ×8 IMPLANT
KIT INFUSE X SMALL 1.4CC (Orthopedic Implant) ×1 IMPLANT
KIT POSITION SURG JACKSON T1 (MISCELLANEOUS) ×4 IMPLANT
KIT PULSE EMG NDL (NEUROSURGERY SUPPLIES) IMPLANT
KIT PULSE EMG NEEDLE (NEUROSURGERY SUPPLIES) IMPLANT
KIT PULSE EMG SURFACE (NEUROSURGERY SUPPLIES) ×1 IMPLANT
KIT PULSE MIOM NDL (NEUROSURGERY SUPPLIES) IMPLANT
KIT PULSE MIOM NEEDLE (NEUROSURGERY SUPPLIES) IMPLANT
KIT PULSE MIOM SURFACE (NEUROSURGERY SUPPLIES) IMPLANT
KIT PULSE XLIF (NEUROSURGERY SUPPLIES) ×1 IMPLANT
KIT SURGICAL ACCESS MAXCESS 4 (KITS) ×1 IMPLANT
KIT TURNOVER KIT B (KITS) ×8 IMPLANT
MARKER SKIN DUAL TIP RULER LAB (MISCELLANEOUS) ×4 IMPLANT
MODULUS XLW 10X22X55MM 10 (Spine Construct) ×1 IMPLANT
NDL HYPO 25X1 1.5 SAFETY (NEEDLE) ×6 IMPLANT
NDL I PASS (NEEDLE) IMPLANT
NEEDLE HYPO 25X1 1.5 SAFETY (NEEDLE) ×8 IMPLANT
NEEDLE I PASS (NEEDLE) ×4 IMPLANT
NS IRRIG 1000ML POUR BTL (IV SOLUTION) ×8 IMPLANT
OIL CARTRIDGE MAESTRO DRILL (MISCELLANEOUS) ×4
PACK LAMINECTOMY NEURO (CUSTOM PROCEDURE TRAY) ×8 IMPLANT
PAD ARMBOARD 7.5X6 YLW CONV (MISCELLANEOUS) ×12 IMPLANT
PATTIES SURGICAL .5 X.5 (GAUZE/BANDAGES/DRESSINGS) IMPLANT
PATTIES SURGICAL .5 X1 (DISPOSABLE) IMPLANT
PATTIES SURGICAL 1X1 (DISPOSABLE) IMPLANT
PROBE PULSE STIMULATION (NEUROSURGERY SUPPLIES) ×1 IMPLANT
PUTTY BONE ATTRAX 10CC STRIP (Putty) ×1 IMPLANT
ROD RELINE MAS LORD 5.5X75MM (Rod) ×1 IMPLANT
ROD RELINE TI MAS 5.5X70MM LD (Rod) ×1 IMPLANT
RUBBER BAND PULSE STRL (MISCELLANEOUS) IMPLANT
SCREW LOCK RELINE 5.5 TULIP (Screw) ×6 IMPLANT
SCREW MAS RELINE 6.5X45 POLY (Screw) ×4 IMPLANT
SCREW MAS RELINE POLY 6.5X40 (Screw) ×2 IMPLANT
SPONGE T-LAP 4X18 ~~LOC~~+RFID (SPONGE) IMPLANT
STAPLER SKIN PROX WIDE 3.9 (STAPLE) ×4 IMPLANT
SUT VIC AB 1 CT1 18XBRD ANBCTR (SUTURE) ×9 IMPLANT
SUT VIC AB 1 CT1 8-18 (SUTURE) ×12
SUT VIC AB 2-0 CT1 18 (SUTURE) ×12 IMPLANT
SUT VIC AB 3-0 SH 8-18 (SUTURE) ×13 IMPLANT
SYR TB 1ML 25GX5/8 (SYRINGE) IMPLANT
TOWEL GREEN STERILE (TOWEL DISPOSABLE) ×8 IMPLANT
TOWEL GREEN STERILE FF (TOWEL DISPOSABLE) ×8 IMPLANT
TRAY FOLEY MTR SLVR 16FR STAT (SET/KITS/TRAYS/PACK) ×8 IMPLANT
WATER STERILE IRR 1000ML POUR (IV SOLUTION) ×8 IMPLANT

## 2021-06-26 NOTE — Interval H&P Note (Signed)
History and Physical Interval Note:  06/26/2021 9:45 AM  Tammy Leonard  has presented today for surgery, with the diagnosis of Idiopathic scoliosis of lumbar region.  The various methods of treatment have been discussed with the patient and family. After consideration of risks, benefits and other options for treatment, the patient has consented to  Procedure(s): Left Lumbar 3-4, Lumbar 4-5 Anterior lateral lumbar interbody fusion with percutaneous pedicle screw fixation (Left) LUMBAR PERCUTANEOUS PEDICLE SCREW 2 LEVEL (N/A) APPLICATION OF PULSE (N/A) APPLICATION OF CELL SAVER (N/A) as a surgical intervention.  The patient's history has been reviewed, patient examined, no change in status, stable for surgery.  I have reviewed the patient's chart and labs.  Questions were answered to the patient's satisfaction.     [name]

## 2021-06-26 NOTE — Op Note (Signed)
06/26/2021  3:14 PM  PATIENT:  Tammy Leonard  75 y.o. female  PRE-OPERATIVE DIAGNOSIS:  Idiopathic scoliosis of lumbar region, lumbar spinal stenosis, lumbar spondylolisthesis, lumbago, radiculopathy L 45 and L 34 levels  POST-OPERATIVE DIAGNOSIS:  Idiopathic scoliosis of lumbar region, lumbar spinal stenosis, lumbar spondylolisthesis, lumbago, radiculopathy L 45 and L 34 levels  PROCEDURE:  Procedure(s): Left Lumbar 3-4, Lumbar 4-5 Anterior lateral lumbar interbody fusion with percutaneous pedicle screw fixation (Left) LUMBAR PERCUTANEOUS PEDICLE SCREW 2 LEVEL (N/A) APPLICATION OF PULSE (N/A) APPLICATION OF CELL SAVER (N/A)  SURGEON:  Surgeon(s) and Role:    Erline Levine, MD - Primary  PHYSICIAN ASSISTANT: Glenford Peers, NP  ASSISTANTS: Poteat, RN   ANESTHESIA:   general  EBL:  50 mL   BLOOD ADMINISTERED:none  DRAINS: none   LOCAL MEDICATIONS USED:  MARCAINE    and LIDOCAINE   SPECIMEN:  No Specimen  DISPOSITION OF SPECIMEN:  N/A  COUNTS:  YES  TOURNIQUET:  * No tourniquets in log *  DICTATION: Patient is a 75 year old with severe spondylosis stenosis, spondylolisthesis and scoliosis of the lumbar spine. It was elected to take her to surgery for anterolateral decompression and posterior pedicle screw fixation at the L 34 and L 45 levels.  Procedure: Patient was brought to the operating room and placed in a lateral decubitus position on the operative table and using orthogonally projected C-arm fluoroscopy the patient was placed so that the L3-4 and L 45 levels were visualized in AP and lateral plane. The patient was then taped into position. The table was flexed so as to expose the L 45 level as the patient has a high iliac crest. Skin was marked along with a posterior finger dissection incision. Her flank was then prepped and draped in usual sterile fashion and incisions were made sequentially at L 45,  L34 levels. Posterior finger dissection was made to enter  the retroperitoneal space and then subsequently the probe was inserted into the psoas muscle from the left side initially at the L 45 level. After mapping the neural elements were able to dock the probe per the midpoint of this vertebral level and without indications electrically of too close proximity to the neural tissues. Subsequently the self-retaining tractor was.after sequential dilators were utilized the shim was employed and the interspace was cleared of psoas muscle and then incised. A thorough discectomy was performed. Instruments were used to clear the interspace of disc material.An anterior entry with posterior trajectory was performed to avoid neural elements.   After thorough discectomy was performed and this was performed using AP and lateral fluoroscopy a 10 lordotic by 55 x 22 mm implant was packed with extra small BMP and Attrax. This was tamped into position and its position was confirmed on AP and lateral fluoroscopy. Subsequently exposure was performed at the L3-4 level and similar dissection was performed with locking of the self-retaining retractor. At this level were able to place an 8 lordotic by 22 x 50 mm implant packed in a similar fashion. Hemostasis was assured the wounds were irrigated and closed with interrupted Vicryl sutures.  Sterile occlusive dressings were placed. Retractor times were:  L 45: 20 minutes;  L 34: 12 minutes.   Patient was then turned into a prone position on the [facility] operating table and using AP and lateral fluoroscopy throughout this portion of the procedure, pedicle screws were placed using Reline Nuvasive cannulated percutaneous screws. 2 screws were placed at L 3 and (6.5 x 45 mm) and  2 at L4 (6.5 x 45), and  2 at L5 (6.5 x 40). 70 mm rod was then affixed to the screw heads and locked down on the screws on the left and 75 mm rod on the right. All connections were then torqued and the Towers were disassembled. The wounds were irrigated and then closed  with 1, 2-0 and 3-0 Vicryl stitches. Sterile occlusive dressing was placed with Dermabond. Long-acting Marcaine was injected. The patient was then extubated in the operating room and taken to recovery in stable and satisfactory condition having tolerated her operation well. Counts were correct at the end of the case.  PLAN OF CARE: Admit to inpatient   PATIENT DISPOSITION:  PACU - hemodynamically stable.   Delay start of Pharmacological VTE agent (>24hrs) due to surgical blood loss or risk of bleeding: yes

## 2021-06-26 NOTE — Brief Op Note (Signed)
06/26/2021  3:14 PM  PATIENT:  Tammy Leonard  75 y.o. female  PRE-OPERATIVE DIAGNOSIS:  Idiopathic scoliosis of lumbar region, lumbar spinal stenosis, lumbar spondylolisthesis, lumbago, radiculopathy L 45 and L 34 levels  POST-OPERATIVE DIAGNOSIS:  Idiopathic scoliosis of lumbar region, lumbar spinal stenosis, lumbar spondylolisthesis, lumbago, radiculopathy L 45 and L 34 levels  PROCEDURE:  Procedure(s): Left Lumbar 3-4, Lumbar 4-5 Anterior lateral lumbar interbody fusion with percutaneous pedicle screw fixation (Left) LUMBAR PERCUTANEOUS PEDICLE SCREW 2 LEVEL (N/A) APPLICATION OF PULSE (N/A) APPLICATION OF CELL SAVER (N/A)  SURGEON:  Surgeon(s) and Role:    Erline Levine, MD - Primary  PHYSICIAN ASSISTANT: Glenford Peers, NP  ASSISTANTS: Poteat, RN   ANESTHESIA:   general  EBL:  50 mL   BLOOD ADMINISTERED:none  DRAINS: none   LOCAL MEDICATIONS USED:  MARCAINE    and LIDOCAINE   SPECIMEN:  No Specimen  DISPOSITION OF SPECIMEN:  N/A  COUNTS:  YES  TOURNIQUET:  * No tourniquets in log *  DICTATION: Patient is a 75 year old with severe spondylosis stenosis, spondylolisthesis and scoliosis of the lumbar spine. It was elected to take her to surgery for anterolateral decompression and posterior pedicle screw fixation at the L 34 and L 45 levels.  Procedure: Patient was brought to the operating room and placed in a lateral decubitus position on the operative table and using orthogonally projected C-arm fluoroscopy the patient was placed so that the L3-4 and L 45 levels were visualized in AP and lateral plane. The patient was then taped into position. The table was flexed so as to expose the L 45 level as the patient has a high iliac crest. Skin was marked along with a posterior finger dissection incision. Her flank was then prepped and draped in usual sterile fashion and incisions were made sequentially at L 45,  L34 levels. Posterior finger dissection was made to enter  the retroperitoneal space and then subsequently the probe was inserted into the psoas muscle from the left side initially at the L 45 level. After mapping the neural elements were able to dock the probe per the midpoint of this vertebral level and without indications electrically of too close proximity to the neural tissues. Subsequently the self-retaining tractor was.after sequential dilators were utilized the shim was employed and the interspace was cleared of psoas muscle and then incised. A thorough discectomy was performed. Instruments were used to clear the interspace of disc material.An anterior entry with posterior trajectory was performed to avoid neural elements.   After thorough discectomy was performed and this was performed using AP and lateral fluoroscopy a 10 lordotic by 55 x 22 mm implant was packed with extra small BMP and Attrax. This was tamped into position and its position was confirmed on AP and lateral fluoroscopy. Subsequently exposure was performed at the L3-4 level and similar dissection was performed with locking of the self-retaining retractor. At this level were able to place an 8 lordotic by 22 x 50 mm implant packed in a similar fashion. Hemostasis was assured the wounds were irrigated and closed with interrupted Vicryl sutures.  Sterile occlusive dressings were placed. Retractor times were:  L 45: 20 minutes;  L 34: 12 minutes.   Patient was then turned into a prone position on the [facility] operating table and using AP and lateral fluoroscopy throughout this portion of the procedure, pedicle screws were placed using Reline Nuvasive cannulated percutaneous screws. 2 screws were placed at L 3 and (6.5 x 45 mm) and  2 at L4 (6.5 x 45), and  2 at L5 (6.5 x 40). 70 mm rod was then affixed to the screw heads and locked down on the screws on the left and 75 mm rod on the right. All connections were then torqued and the Towers were disassembled. The wounds were irrigated and then closed  with 1, 2-0 and 3-0 Vicryl stitches. Sterile occlusive dressing was placed with Dermabond. Long-acting Marcaine was injected. The patient was then extubated in the operating room and taken to recovery in stable and satisfactory condition having tolerated her operation well. Counts were correct at the end of the case.  PLAN OF CARE: Admit to inpatient   PATIENT DISPOSITION:  PACU - hemodynamically stable.   Delay start of Pharmacological VTE agent (>24hrs) due to surgical blood loss or risk of bleeding: yes

## 2021-06-26 NOTE — Anesthesia Procedure Notes (Signed)
Procedure Name: Intubation Date/Time: 06/26/2021 10:52 AM Performed by: Thelma Comp, CRNA Pre-anesthesia Checklist: Patient identified, Emergency Drugs available, Suction available and Patient being monitored Patient Re-evaluated:Patient Re-evaluated prior to induction Oxygen Delivery Method: Circle System Utilized Preoxygenation: Pre-oxygenation with 100% oxygen Induction Type: IV induction Ventilation: Mask ventilation without difficulty Laryngoscope Size: Mac and 3 Grade View: Grade I Tube type: Oral Tube size: 7.0 mm Number of attempts: 1 Airway Equipment and Method: Stylet Placement Confirmation: ETT inserted through vocal cords under direct vision, positive ETCO2 and breath sounds checked- equal and bilateral Secured at: 22 cm Tube secured with: Tape Dental Injury: Teeth and Oropharynx as per pre-operative assessment

## 2021-06-26 NOTE — Transfer of Care (Signed)
Immediate Anesthesia Transfer of Care Note  Patient: Tammy Leonard  Procedure(s) Performed: Left Lumbar 3-4, Lumbar 4-5 Anterior lateral lumbar interbody fusion with percutaneous pedicle screw fixation (Left: Flank) LUMBAR PERCUTANEOUS PEDICLE SCREW 2 LEVEL (Back) APPLICATION OF PULSE (Back) APPLICATION OF CELL SAVER  Patient Location: PACU  Anesthesia Type:General  Level of Consciousness: awake, drowsy and patient cooperative  Airway & Oxygen Therapy: Patient Spontanous Breathing  Post-op Assessment: Report given to RN and Post -op Vital signs reviewed and stable  Post vital signs: Reviewed and stable  Last Vitals:  Vitals Value Taken Time  BP 130/57 06/26/21 1518  Temp    Pulse 57 06/26/21 1520  Resp 31 06/26/21 1520  SpO2 100 % 06/26/21 1520  Vitals shown include unvalidated device data.  Last Pain:  Vitals:   06/26/21 1508  TempSrc:   PainSc: 10-Worst pain ever         Complications: No notable events documented.

## 2021-06-27 MED ORDER — OXYCODONE-ACETAMINOPHEN 5-325 MG PO TABS: 1.0000 | ORAL_TABLET | ORAL | 0 refills | Status: DC | PRN

## 2021-06-27 MED ORDER — METHOCARBAMOL 500 MG PO TABS: 500.0000 mg | ORAL_TABLET | Freq: Four times a day (QID) | ORAL | 0 refills | Status: DC | PRN

## 2021-06-27 NOTE — Progress Notes (Signed)
Patient was transported via wheelchair by volunteer for discharge home; in no acute distress nor complaints of pain nor discomfort; dressing were clean, dry and intact; room was checked and accounted for all her belongings; discharge instructions given to patient and her family by RN and they all verbalized understanding on the instructions given.

## 2021-06-27 NOTE — Plan of Care (Signed)
  Problem: Education: Goal: Ability to verbalize activity precautions or restrictions will improve Outcome: Completed/Met Goal: Knowledge of the prescribed therapeutic regimen will improve Outcome: Completed/Met Goal: Understanding of discharge needs will improve Outcome: Completed/Met   Problem: Bowel/Gastric: Goal: Gastrointestinal status for postoperative course will improve Outcome: Completed/Met   Problem: Clinical Measurements: Goal: Ability to maintain clinical measurements within normal limits will improve Outcome: Completed/Met Goal: Postoperative complications will be avoided or minimized Outcome: Completed/Met Goal: Diagnostic test results will improve Outcome: Completed/Met   Problem: Skin Integrity: Goal: Will show signs of wound healing Outcome: Completed/Met   Problem: Health Behavior/Discharge Planning: Goal: Identification of resources available to assist in meeting health care needs will improve Outcome: Completed/Met   Problem: Safety: Goal: Ability to remain free from injury will improve Outcome: Completed/Met

## 2021-06-27 NOTE — Discharge Instructions (Signed)

## 2021-06-27 NOTE — Discharge Summary (Signed)
Physician Discharge Summary  Patient ID: Tammy Leonard MRN: [id number] DOB/AGE: August 21, 1946 75 y.o.  Admit date: 06/26/2021 Discharge date: 06/27/2021  Admission Diagnoses:  Idiopathic scoliosis of lumbar region, lumbar spinal stenosis, lumbar spondylolisthesis, lumbago, radiculopathy L 45 and L 34 levels  Discharge Diagnoses:  Idiopathic scoliosis of lumbar region, lumbar spinal stenosis, lumbar spondylolisthesis, lumbago, radiculopathy L 45 and L 34 levels Active Problems:   Spondylolisthesis of lumbar region   Discharged Condition: good  Hospital Course: The patient was admitted on 06/26/2021 and taken to the operating room where the patient underwent left-sided XL IF L3-4 and L4-5 levels with percutaneous pedicle screw fixation. The patient tolerated the procedure well and was taken to the recovery room and then to the floor in stable condition. The hospital course was routine. There were no complications. The wound remained clean dry and intact. Pt had appropriate back soreness. No complaints of leg pain or new N/T/W. The patient remained afebrile with stable vital signs, and tolerated a regular diet. The patient continued to increase activities, and pain was well controlled with oral pain medications.   Consults: None  Significant Diagnostic Studies: radiology: X-Ray: intraoperative   Treatments: surgery:   Left Lumbar 3-4, Lumbar 4-5 Anterior lateral lumbar interbody fusion with percutaneous pedicle screw fixation (Left) LUMBAR PERCUTANEOUS PEDICLE SCREW 2 LEVEL (N/A) APPLICATION OF PULSE (N/A) APPLICATION OF CELL SAVER (N/A)  Discharge Exam: Blood pressure (!) 140/54, pulse 79, temperature 98.9 F (37.2 C), temperature source Oral, resp. rate 18, height 5' 0.5" (1.537 m), weight 57.2 kg, last menstrual period 10/13/1999, SpO2 99 %. Physical Exam: Patient is awake, A/O X 4, conversant, and in good spirits. They are in NAD and VSS. Doing well. Speech is fluent and  appropriate. MAEW with good strength. Sensation to light touch is intact. PERLA, EOMI. CNs grossly intact. Dressings are clean dry intact. Incisions are well approximated with no drainage, erythema, or edema. LSO brace in place  Disposition: Discharge disposition: 01-Home or Self Care        Allergies as of 06/27/2021       Reactions   Prochlorperazine Edisylate Anxiety, Other (See Comments)   Lisinopril    Patient had a respiratory illness with cough and change made in her blood pressure medication. She did not have an allergic reaction or angioedema        Medication List     STOP taking these medications    diclofenac 50 MG EC tablet Commonly known as: VOLTAREN   HYDROcodone-acetaminophen 5-325 MG tablet Commonly known as: NORCO/VICODIN       TAKE these medications    ALPRAZolam 0.5 MG tablet Commonly known as: XANAX TAKE 1 TABLET AT BEDTIME AS NEEDED.   amLODipine 2.5 MG tablet Commonly known as: NORVASC Take 1 tablet (2.5 mg total) by mouth daily.   CALCIUM PO Take 1 tablet by mouth daily.   methocarbamol 500 MG tablet Commonly known as: ROBAXIN Take 1 tablet (500 mg total) by mouth every 6 (six) hours as needed for muscle spasms.   MULTIVITAMIN PO Take 1 tablet by mouth daily.   oxyCODONE-acetaminophen 5-325 MG tablet Commonly known as: Percocet Take 1-2 tablets by mouth every 4 (four) hours as needed for severe pain.   UNABLE TO FIND Take 1 tablet by mouth daily. Mag R & R (muscle recovery & sleep support   VITAMIN B COMPLEX PO Take 1 tablet by mouth daily.         Signed: Marvis Moeller, DNP, NP-C 06/27/2021,  8:39 AM

## 2021-06-27 NOTE — Evaluation (Signed)
Physical Therapy Evaluation Patient Details Name: Tammy Leonard MRN: [id number] DOB: 04-25-1946 Today's Date: 06/27/2021  History of Present Illness  Pt is a 75 y/o female presenting for elective L3-L5 ALIF 2 idiopathic scoliosis, lumbar stenosis, lumbago and radiculopathy. PMHx significant for HTN, squamous cell carcinoma, osteopenia and prediabetes.   Clinical Impression  Pt admitted with above diagnosis. At the time of PT eval, pt was able to demonstrate transfers and ambulation with gross min guard assist. Several mild LOB noted throughout session, especially with attempting to position herself in front of the toilet and manage clothing. However, no assist required to recover, just close hands on guarding. Pt declined ambulation in the hallway due to pain and nausea.. Pt currently with functional limitations due to the deficits listed below (see PT Problem List). Pt will benefit from skilled PT to increase their independence and safety with mobility to allow discharge to the venue listed below.         Recommendations for follow up therapy are one component of a multi-disciplinary discharge planning process, led by the attending physician.  Recommendations may be updated based on patient status, additional functional criteria and insurance authorization.  Follow Up Recommendations No PT follow up;Supervision for mobility/OOB    Equipment Recommendations  None recommended by PT    Recommendations for Other Services       Precautions / Restrictions Precautions Precautions: Back Precaution Booklet Issued: Yes (comment) Precaution Comments: Reviewed handout and pt was cued for precautions during functional mobility. Required Braces or Orthoses: Spinal Brace Spinal Brace: Applied in sitting position;Applied in standing position;Lumbar corset Restrictions Weight Bearing Restrictions: No      Mobility  Bed Mobility Overal bed mobility: Modified Independent              General bed mobility comments: Pt was received sitting up in the chair.    Transfers Overall transfer level: Needs assistance Equipment used: None Transfers: Sit to/from Stand Sit to Stand: Min guard         General transfer comment: Close guard for safety. Increased time and effort to power-up to full stand.  Ambulation/Gait Ambulation/Gait assistance: Min guard Gait Distance (Feet): 25 Feet Assistive device: None Gait Pattern/deviations: Step-through pattern;Decreased stride length;Trunk flexed;Wide base of support;Antalgic Gait velocity: Decreased Gait velocity interpretation: <1.31 ft/sec, indicative of household ambulator General Gait Details: Wide BOS, antalgic in nature. Pt reports pain and had soft knees throughout. Several mild LOB with hands-on guarding provided however no assist required - pt was able to recover on her own.  Stairs            Wheelchair Mobility    Modified Rankin (Stroke Patients Only)       Balance Overall balance assessment: Mild deficits observed, not formally tested                                           Pertinent Vitals/Pain Pain Assessment: Faces Faces Pain Scale: Hurts little more Pain Location: Incisional Pain Descriptors / Indicators: Aching;Sore;Grimacing;Guarding Pain Intervention(s): Limited activity within patient's tolerance;Monitored during session;Repositioned    Home Living Family/patient expects to be discharged to:: Private residence Living Arrangements: Spouse/significant other;Other (Comment) (Daughter in town until Sunday.) Available Help at Discharge: Family;Available 24 hours/day Type of Home: House Home Access: Level entry     Home Layout: Two level;Able to live on main level with bedroom/bathroom;Full bath on main  level (Master bedroom upstairs) Home Equipment: Walker - 2 wheels;Cane - single point;Bedside commode;Shower seat;Tub bench;Adaptive equipment;Other (comment) (bed  rail)      Prior Function Level of Independence: Independent         Comments: I with ADLs/IADLs PTA. Reports requiring frequent seated rest breaks during IADLs including cooking.     Hand Dominance        Extremity/Trunk Assessment   Upper Extremity Assessment Upper Extremity Assessment: Defer to OT evaluation    Lower Extremity Assessment Lower Extremity Assessment: Generalized weakness    Cervical / Trunk Assessment Cervical / Trunk Assessment: Other exceptions Cervical / Trunk Exceptions: s/p spinal surgery.  Communication   Communication: No difficulties  Cognition Arousal/Alertness: Awake/alert Behavior During Therapy: Anxious Overall Cognitive Status: Within Functional Limits for tasks assessed                                        General Comments General comments (skin integrity, edema, etc.): Clean/dry dressing at incision. Family present at bedside. Very supportive.    Exercises     Assessment/Plan    PT Assessment Patient needs continued PT services  PT Problem List Decreased strength;Decreased activity tolerance;Decreased balance;Decreased mobility;Decreased knowledge of use of DME;Decreased safety awareness;Decreased knowledge of precautions;Pain       PT Treatment Interventions DME instruction;Gait training;Functional mobility training;Therapeutic activities;Therapeutic exercise;Neuromuscular re-education;Patient/family education;Stair training;Balance training    PT Goals (Current goals can be found in the Care Plan section)  Acute Rehab PT Goals Patient Stated Goal: To decrease pain. PT Goal Formulation: With patient/family Time For Goal Achievement: 07/04/21 Potential to Achieve Goals: Good    Frequency Min 5X/week   Barriers to discharge        Co-evaluation               AM-PAC PT "6 Clicks" Mobility  Outcome Measure Help needed turning from your back to your side while in a flat bed without using  bedrails?: A Little Help needed moving from lying on your back to sitting on the side of a flat bed without using bedrails?: A Little Help needed moving to and from a bed to a chair (including a wheelchair)?: A Little Help needed standing up from a chair using your arms (e.g., wheelchair or bedside chair)?: A Little Help needed to walk in hospital room?: A Little Help needed climbing 3-5 steps with a railing? : A Little 6 Click Score: 18    End of Session Equipment Utilized During Treatment: Back brace Activity Tolerance: Patient limited by pain (and nausea) Patient left: in chair;with call bell/phone within reach;with family/visitor present Nurse Communication: Mobility status PT Visit Diagnosis: Unsteadiness on feet (R26.81);Pain Pain - part of body:  (back)    Time: [id number] PT Time Calculation (min) (ACUTE ONLY): 12 min   Charges:   PT Evaluation $PT Eval Low Complexity: 1 Low          Rolinda Roan, PT, DPT Acute Rehabilitation Services Pager: [phone] Office: [phone]   Thelma Comp 06/27/2021, 9:21 AM

## 2021-06-27 NOTE — Evaluation (Signed)
Occupational Therapy Evaluation Patient Details Name: Tammy Leonard MRN: [id number] DOB: [birth year] Today's Date: 06/27/2021   History of Present Illness 75 y.o. female presenting for elective L3-4 adn L4-5 ALIF secondary to Idiopathic scoliosis, lumbar stenosis, lumbago and radiculopathy. PMHx significant for HTN, squamous cell carcinoma, osteopenia and prediabetes.   Clinical Impression   PTA patient was living with her spouse in a 2-level private residence with guest bedroom and full bathroom on main level and was independent with ADLs/IADLs without AD. Patient notes difficulty standing for long periods of time PTA secondary to lumbago. Patient currently presents near baseline level of function demonstrating observed ADLs with set-up to Min guard with AE. OT provided patient/family education on spinal precautions, home set-up to maximize safety and independence with self-care tasks, and acquisition/use of AE. All participants expressed verbal understanding. Patient does not require continued acute occupational therapy services with OT to sign off at this time.         Recommendations for follow up therapy are one component of a multi-disciplinary discharge planning process, led by the attending physician.  Recommendations may be updated based on patient status, additional functional criteria and insurance authorization.   Follow Up Recommendations  No OT follow up;Supervision - Intermittent    Equipment Recommendations  None recommended by OT    Recommendations for Other Services       Precautions / Restrictions Precautions Precautions: Back Precaution Booklet Issued: Yes (comment) Precaution Comments: Written and verbal education provided. Patient able to recall 3/3 spinal precautions at conclusion of session. Required Braces or Orthoses: Spinal Brace Spinal Brace: Applied in sitting position;Applied in standing position;Lumbar corset Restrictions Weight Bearing  Restrictions: No      Mobility Bed Mobility Overal bed mobility: Modified Independent             General bed mobility comments: +rail    Transfers Overall transfer level: Needs assistance Equipment used: 1 person hand held assist Transfers: Sit to/from Stand Sit to Stand: Min guard         General transfer comment: Min guard for sit to stand from EOB x4 reps with cues for hand placement.    Balance Overall balance assessment: Mild deficits observed, not formally tested                                         ADL either performed or assessed with clinical judgement   ADL Overall ADL's : Needs assistance/impaired Eating/Feeding: Independent   Grooming: Supervision/safety;Standing   Upper Body Bathing: Set up;Sitting   Lower Body Bathing: Min guard;Sit to/from stand   Upper Body Dressing : Set up;Sitting   Lower Body Dressing: Min guard;Sit to/from stand Lower Body Dressing Details (indicate cue type and reason): Use of reacher to thread BLE. Min gaurd to hike pants over hips in standing. Toilet Transfer: Copy Details (indicate cue type and reason): To standard height commode in bathroom. Able to sit <> stand x4 reps with use of grab bar.                 Vision Patient Visual Report: No change from baseline       Perception     Praxis      Pertinent Vitals/Pain Pain Assessment: Faces Faces Pain Scale: Hurts little more Pain Location: Incisional Pain Descriptors / Indicators: Aching;Sore;Grimacing;Guarding Pain Intervention(s): Limited activity within patient's tolerance;Monitored during session;Premedicated before session;Repositioned  Hand Dominance     Extremity/Trunk Assessment Upper Extremity Assessment Upper Extremity Assessment: Overall WFL for tasks assessed   Lower Extremity Assessment Lower Extremity Assessment: Defer to PT evaluation   Cervical / Trunk Assessment Cervical /  Trunk Assessment: Other exceptions Cervical / Trunk Exceptions: s/p spinal surgery.   Communication Communication Communication: No difficulties   Cognition Arousal/Alertness: Awake/alert Behavior During Therapy: WFL for tasks assessed/performed Overall Cognitive Status: Within Functional Limits for tasks assessed                                     General Comments  Clean/dry dressing at incision. Family present at bedside. Very supportive.    Exercises     Shoulder Instructions      Home Living Family/patient expects to be discharged to:: Private residence Living Arrangements: Spouse/significant other;Other (Comment) (Daughter in town until Sunday.) Available Help at Discharge: Family;Available 24 hours/day Type of Home: House Home Access: Level entry     Home Layout: Two level;Able to live on main level with bedroom/bathroom;Full bath on main level     Bathroom Shower/Tub: Teacher, early years/pre: Standard     Home Equipment: Environmental consultant - 2 wheels;Cane - single point;Bedside commode;Shower seat;Tub bench;Adaptive equipment;Other (comment) (bed rail) Adaptive Equipment: Reacher        Prior Functioning/Environment Level of Independence: Independent with assistive device(s)        Comments: I with ADLs/IADLs PTA. Reports requiring frequent seated rest breaks during IADLs including cooking.        OT Problem List: Impaired balance (sitting and/or standing)      OT Treatment/Interventions:      OT Goals(Current goals can be found in the care plan section) Acute Rehab OT Goals Patient Stated Goal: To decrease pain. OT Goal Formulation: With patient Time For Goal Achievement: 07/11/21 Potential to Achieve Goals: Good  OT Frequency:     Barriers to D/C:            Co-evaluation              AM-PAC OT "6 Clicks" Daily Activity     Outcome Measure Help from another person eating meals?: None Help from another person taking  care of personal grooming?: A Little Help from another person toileting, which includes using toliet, bedpan, or urinal?: A Little Help from another person bathing (including washing, rinsing, drying)?: A Little Help from another person to put on and taking off regular upper body clothing?: A Little Help from another person to put on and taking off regular lower body clothing?: A Little 6 Click Score: 19   End of Session Equipment Utilized During Treatment: Back brace Nurse Communication: Mobility status  Activity Tolerance: Patient tolerated treatment well Patient left: in chair;with call bell/phone within reach  OT Visit Diagnosis: Unsteadiness on feet (R26.81);Muscle weakness (generalized) (M62.81);Pain Pain - part of body:  (incisional)                Time: [id number] OT Time Calculation (min): 27 min Charges:  OT General Charges $OT Visit: 1 Visit OT Evaluation $OT Eval Low Complexity: 1 Low OT Treatments $Self Care/Home Management : 8-22 mins  Barbi Kumagai H. OTR/L Supplemental OT, Department of rehab services [phone]  Mazin Emma R H. 06/27/2021, 8:33 AM

## 2021-06-27 NOTE — Anesthesia Postprocedure Evaluation (Signed)
Anesthesia Post Note  Patient: Malky Leccese  Procedure(s) Performed: Left Lumbar 3-4, Lumbar 4-5 Anterior lateral lumbar interbody fusion with percutaneous pedicle screw fixation (Left: Flank) LUMBAR PERCUTANEOUS PEDICLE SCREW 2 LEVEL (Back) APPLICATION OF PULSE (Back) APPLICATION OF CELL SAVER     Patient location during evaluation: PACU Anesthesia Type: General Level of consciousness: awake and alert Pain management: pain level controlled Vital Signs Assessment: post-procedure vital signs reviewed and stable Respiratory status: spontaneous breathing, nonlabored ventilation and respiratory function stable Cardiovascular status: stable and blood pressure returned to baseline Anesthetic complications: no   No notable events documented.  Last Vitals:  Vitals:   06/27/21 0403 06/27/21 0717  BP: (!) 157/70 (!) 140/54  Pulse: 75 79  Resp: 18 18  Temp: 37.3 C 37.2 C  SpO2: 100% 99%                  Audry Pili

## 2021-06-27 NOTE — Progress Notes (Signed)
Subjective: Patient reports that she is doing well and is pleased with her postoperative status. She reports appropriate back pain that is well controlled on PO analgesics. No acute events overnight.   Objective: Vital signs in last 24 hours: Temp:  [97.3 F (36.3 C)-99.1 F (37.3 C)] 98.9 F (37.2 C) (09/16 0717) Pulse Rate:  [59-95] 79 (09/16 0717) Resp:  [12-20] 18 (09/16 0717) BP: (103-182)/(43-80) 140/54 (09/16 0717) SpO2:  [95 %-100 %] 99 % (09/16 0717) Weight:  [57.2 kg] 57.2 kg (09/15 0827)  Intake/Output from previous day: 09/15 0701 - 09/16 0700 In: 1400 [I.V.:1400] Out: 200 [Urine:150; Blood:50] Intake/Output this shift: No intake/output data recorded.  Physical Exam: Patient is awake, A/O X 4, conversant, and in good spirits. They are in NAD and VSS. Doing well. Speech is fluent and appropriate. MAEW with good strength. Sensation to light touch is intact. PERLA, EOMI. CNs grossly intact. Dressings are clean dry intact. Incisions are well approximated with no drainage, erythema, or edema. LSO brace in place  Lab Results: No results for input(s): WBC, HGB, HCT, PLT in the last 72 hours. BMET No results for input(s): NA, K, CL, CO2, GLUCOSE, BUN, CREATININE, CALCIUM in the last 72 hours.  Studies/Results: DG Lumbar Spine 2-3 Views  Result Date: 06/26/2021 CLINICAL DATA:  Lumbar interbody fusion, intraoperative exam EXAM: LUMBAR SPINE - 2-3 VIEW COMPARISON:  04/30/2021 FINDINGS: Two fluoroscopic images are obtained during the performance of the procedure and are provided for interpretation only. 2 level discectomy with 3 level posterior fusion identified with near anatomic alignment. Exact localization is difficult due to small field of view. Please refer to operative report. FLUOROSCOPY TIME:  156.4 seconds IMPRESSION: 1. Intraoperative exam as above. Please refer to the operative report. Electronically Signed   By: Randa Ngo M.D.   On: 06/26/2021 16:32   DG C-Arm 1-60  Min-No Report  Result Date: 06/26/2021 Fluoroscopy was utilized by the requesting physician.  No radiographic interpretation.    Assessment/Plan: Patient is post-op day 1 s/p left-sided XLIF L3-4 and L4-5 levels with percutaneous pedicle screw fixation. She is recovering well and reports a resolution of her preoperative symptoms. Her only complaint is mild incisional discomfort.  She has ambulated with nursing staff and worked with OT. She is awaiting PT evaluation. Continue LSO brace when OOB. Continue working on pain control, mobility and ambulating patient. Will plan for discharge today.    LOS: 1 day     Marvis Moeller, DNP, NP-C 06/27/2021, 8:05 AM

## 2021-06-27 NOTE — Progress Notes (Signed)
Physical Therapy Treatment Patient Details Name: Tammy Leonard MRN: [id number] DOB: [birth year] Today's Date: 06/27/2021   History of Present Illness Pt is a 75 y/o female presenting for elective L3-L5 ALIF 2 idiopathic scoliosis, lumbar stenosis, lumbago and radiculopathy. PMHx significant for HTN, squamous cell carcinoma, osteopenia and prediabetes.    PT Comments    Pt progressing with post-op mobility. She was able to demonstrate transfers and ambulation with gross min guard assist to supervision for safety and RW for pain control and energy conservation. Reinforced education on precautions, brace application/wearing schedule, appropriate activity progression, and car transfer. Pt was not able to manage stairs due to pain, however has a bedroom downstairs with full bath that she plans to stay in until she can manage the flight up to her master bedroom. From a mobility standpoint, pt is safe to d/c home, however pt and family concerned with pt's complaints of pain and report wanting her to stay another day. Will continue to follow.      Recommendations for follow up therapy are one component of a multi-disciplinary discharge planning process, led by the attending physician.  Recommendations may be updated based on patient status, additional functional criteria and insurance authorization.  Follow Up Recommendations  No PT follow up;Supervision for mobility/OOB     Equipment Recommendations  None recommended by PT    Recommendations for Other Services       Precautions / Restrictions Precautions Precautions: Back Precaution Booklet Issued: Yes (comment) Precaution Comments: Reviewed handout and pt was cued for precautions during functional mobility. Required Braces or Orthoses: Spinal Brace Spinal Brace: Applied in sitting position;Applied in standing position;Lumbar corset Restrictions Weight Bearing Restrictions: No     Mobility  Bed Mobility Overal bed mobility:  Modified Independent             General bed mobility comments: HOB flat and rails lowered to simulate home environment    Transfers Overall transfer level: Needs assistance Equipment used: None;Rolling walker (2 wheeled) Transfers: Sit to/from Stand Sit to Stand: Min guard         General transfer comment: Close guard for safety. Increased time and effort to power-up to full stand due to pain.  Ambulation/Gait Ambulation/Gait assistance: Min guard;Supervision Gait Distance (Feet): 175 Feet Assistive device: Rolling walker (2 wheeled) Gait Pattern/deviations: Step-through pattern;Decreased stride length;Trunk flexed;Wide base of support;Antalgic Gait velocity: Decreased Gait velocity interpretation: <1.31 ft/sec, indicative of household ambulator General Gait Details: With RW, pt ambulating with improved gait pattern and reports decreased pain. Min guard assist progressing to supervision for safety. No assist required. Occasional VC's for improved posture.   Stairs Stairs: Yes Stairs assistance: Min assist Stair Management: One rail Left;Forwards (HHA on R) Number of Stairs: 1 General stair comments: Pt with increased pain and difficulty completing 1 stair. Attempted sideways and forwards. Could only manage 1 stair due to pain.   Wheelchair Mobility    Modified Rankin (Stroke Patients Only)       Balance Overall balance assessment: Needs assistance Sitting-balance support: Feet supported;No upper extremity supported Sitting balance-Leahy Scale: Fair     Standing balance support: No upper extremity supported;During functional activity Standing balance-Leahy Scale: Fair Standing balance comment: Used RW for pain control and energy conservation.                            Cognition Arousal/Alertness: Awake/alert Behavior During Therapy: Anxious Overall Cognitive Status: Within Functional Limits for tasks assessed  Exercises      General Comments General comments (skin integrity, edema, etc.): Clean/dry dressing at incision. Family present at bedside. Very supportive.      Pertinent Vitals/Pain Pain Assessment: Faces Faces Pain Scale: Hurts even more Pain Location: Incisional Pain Descriptors / Indicators: Aching;Sore;Grimacing;Guarding Pain Intervention(s): Limited activity within patient's tolerance;Monitored during session;Repositioned    Home Living Family/patient expects to be discharged to:: Private residence Living Arrangements: Spouse/significant other;Other (Comment) (Daughter in town until Sunday.) Available Help at Discharge: Family;Available 24 hours/day Type of Home: House Home Access: Level entry   Home Layout: Two level;Able to live on main level with bedroom/bathroom;Full bath on main level (Master bedroom upstairs) Home Equipment: Walker - 2 wheels;Cane - single point;Bedside commode;Shower seat;Tub bench;Adaptive equipment;Other (comment) (bed rail)      Prior Function Level of Independence: Independent      Comments: I with ADLs/IADLs PTA. Reports requiring frequent seated rest breaks during IADLs including cooking.   PT Goals (current goals can now be found in the care plan section) Acute Rehab PT Goals Patient Stated Goal: To decrease pain. PT Goal Formulation: With patient/family Time For Goal Achievement: 07/04/21 Potential to Achieve Goals: Good Progress towards PT goals: Progressing toward goals    Frequency    Min 5X/week      PT Plan Current plan remains appropriate    Co-evaluation              AM-PAC PT "6 Clicks" Mobility   Outcome Measure  Help needed turning from your back to your side while in a flat bed without using bedrails?: A Little Help needed moving from lying on your back to sitting on the side of a flat bed without using bedrails?: A Little Help needed moving to and from a bed to a chair (including a  wheelchair)?: A Little Help needed standing up from a chair using your arms (e.g., wheelchair or bedside chair)?: A Little Help needed to walk in hospital room?: A Little Help needed climbing 3-5 steps with a railing? : A Little 6 Click Score: 18    End of Session Equipment Utilized During Treatment: Back brace Activity Tolerance: Patient limited by pain Patient left: in bed;with call bell/phone within reach;with family/visitor present Nurse Communication: Mobility status PT Visit Diagnosis: Unsteadiness on feet (R26.81);Pain Pain - part of body:  (back)     Time: [id number] PT Time Calculation (min) (ACUTE ONLY): 27 min  Charges:  $Gait Training: 23-37 mins                     Rolinda Roan, PT, DPT Acute Rehabilitation Services Pager: [phone] Office: [phone]    Thelma Comp 06/27/2021, 11:29 AM

## 2021-07-01 ENCOUNTER — Telehealth: Payer: Self-pay

## 2021-07-01 NOTE — Telephone Encounter (Signed)
Transition Care Management Follow-up Telephone Call Date of discharge and from where: 06/27/2021 [facility] How have you been since you were released from the hospital? Continues to have pain.  Reports not being able to have a bowel movement. Any questions or concerns? Yes  Reports she was under the understanding that PT had been ordered. I reviewed the PT suggestion and it was noted NO PT NEEDED. Patient reports her legs are weak and noodle like. Encouraged patient to send a my chart message to surgeon and request PT.   Items Reviewed: Did the pt receive and understand the discharge instructions provided? Yes  Medications obtained and verified? Yes  Other? Yes  Reports constipation with last bowel movement on date of surgery Any new allergies since your discharge? No  Dietary orders reviewed? Yes Do you have support at home? Yes   Home Care and Equipment/Supplies: Were home health services ordered? no If so, what is the name of the agency?  Has the agency set up a time to come to the patient's home? not applicable Were any new equipment or medical supplies ordered?  No What is the name of the medical supply agency?  Were you able to get the supplies/equipment? not applicable Do you have any questions related to the use of the equipment or supplies? No  Functional Questionnaire: (I = Independent and D = Dependent) ADLs: I  Bathing/Dressing- I  Meal Prep- I  Eating- I  Maintaining continence- constipated  Transferring/Ambulation- using walker  Managing Meds- I  Follow up appointments reviewed:  PCP Hospital f/u appt confirmed? No   Specialist Hospital f/u appt confirmed?  Scheduled to see surgeon in October Are transportation arrangements needed? No  If their condition worsens, is the pt aware to call PCP or go to the Emergency Dept.? Yes Was the patient provided with contact information for the PCP's office or ED? Yes Was to pt encouraged to call back with questions or  concerns? Yes   Encouraged patient to notify surgeon of need for PT and to report constipation. Encouraged patient to move about as much as she can and to stay hydrated.   Tomasa Rand, RN, BSN, CEN [facility] [employer] [phone]

## 2021-07-02 ENCOUNTER — Encounter: Payer: Self-pay | Admitting: Family Medicine

## 2021-07-02 ENCOUNTER — Encounter (HOSPITAL_COMMUNITY): Payer: Self-pay | Admitting: Neurosurgery

## 2021-07-02 MED FILL — Heparin Sodium (Porcine) Inj 1000 Unit/ML: INTRAMUSCULAR | Qty: 30 | Status: AC

## 2021-07-02 MED FILL — Sodium Chloride IV Soln 0.9%: INTRAVENOUS | Qty: 1000 | Status: AC

## 2021-07-03 DIAGNOSIS — M5116 Intervertebral disc disorders with radiculopathy, lumbar region: Secondary | ICD-10-CM | POA: Diagnosis not present

## 2021-07-03 DIAGNOSIS — Z79891 Long term (current) use of opiate analgesic: Secondary | ICD-10-CM | POA: Diagnosis not present

## 2021-07-03 DIAGNOSIS — Z4789 Encounter for other orthopedic aftercare: Secondary | ICD-10-CM | POA: Diagnosis not present

## 2021-07-03 DIAGNOSIS — I10 Essential (primary) hypertension: Secondary | ICD-10-CM | POA: Diagnosis not present

## 2021-07-03 DIAGNOSIS — M4316 Spondylolisthesis, lumbar region: Secondary | ICD-10-CM | POA: Diagnosis not present

## 2021-07-03 DIAGNOSIS — Z791 Long term (current) use of non-steroidal anti-inflammatories (NSAID): Secondary | ICD-10-CM | POA: Diagnosis not present

## 2021-07-03 DIAGNOSIS — Z4782 Encounter for orthopedic aftercare following scoliosis surgery: Secondary | ICD-10-CM | POA: Diagnosis not present

## 2021-07-03 DIAGNOSIS — Z981 Arthrodesis status: Secondary | ICD-10-CM | POA: Diagnosis not present

## 2021-07-03 DIAGNOSIS — M4126 Other idiopathic scoliosis, lumbar region: Secondary | ICD-10-CM | POA: Diagnosis not present

## 2021-07-03 DIAGNOSIS — Z9181 History of falling: Secondary | ICD-10-CM | POA: Diagnosis not present

## 2021-07-11 DIAGNOSIS — M4126 Other idiopathic scoliosis, lumbar region: Secondary | ICD-10-CM | POA: Diagnosis not present

## 2021-07-11 DIAGNOSIS — I10 Essential (primary) hypertension: Secondary | ICD-10-CM | POA: Diagnosis not present

## 2021-07-11 DIAGNOSIS — M5116 Intervertebral disc disorders with radiculopathy, lumbar region: Secondary | ICD-10-CM | POA: Diagnosis not present

## 2021-07-11 DIAGNOSIS — Z4782 Encounter for orthopedic aftercare following scoliosis surgery: Secondary | ICD-10-CM | POA: Diagnosis not present

## 2021-07-11 DIAGNOSIS — M4316 Spondylolisthesis, lumbar region: Secondary | ICD-10-CM | POA: Diagnosis not present

## 2021-07-11 DIAGNOSIS — Z4789 Encounter for other orthopedic aftercare: Secondary | ICD-10-CM | POA: Diagnosis not present

## 2021-07-15 DIAGNOSIS — M4126 Other idiopathic scoliosis, lumbar region: Secondary | ICD-10-CM | POA: Diagnosis not present

## 2021-07-15 DIAGNOSIS — Z4789 Encounter for other orthopedic aftercare: Secondary | ICD-10-CM | POA: Diagnosis not present

## 2021-07-15 DIAGNOSIS — I10 Essential (primary) hypertension: Secondary | ICD-10-CM | POA: Diagnosis not present

## 2021-07-15 DIAGNOSIS — M4316 Spondylolisthesis, lumbar region: Secondary | ICD-10-CM | POA: Diagnosis not present

## 2021-07-15 DIAGNOSIS — Z4782 Encounter for orthopedic aftercare following scoliosis surgery: Secondary | ICD-10-CM | POA: Diagnosis not present

## 2021-07-15 DIAGNOSIS — M5116 Intervertebral disc disorders with radiculopathy, lumbar region: Secondary | ICD-10-CM | POA: Diagnosis not present

## 2021-07-17 DIAGNOSIS — M545 Low back pain, unspecified: Secondary | ICD-10-CM | POA: Diagnosis not present

## 2021-07-17 DIAGNOSIS — M4316 Spondylolisthesis, lumbar region: Secondary | ICD-10-CM | POA: Diagnosis not present

## 2021-07-17 DIAGNOSIS — M4126 Other idiopathic scoliosis, lumbar region: Secondary | ICD-10-CM | POA: Diagnosis not present

## 2021-07-17 DIAGNOSIS — M48061 Spinal stenosis, lumbar region without neurogenic claudication: Secondary | ICD-10-CM | POA: Diagnosis not present

## 2021-07-17 DIAGNOSIS — M5416 Radiculopathy, lumbar region: Secondary | ICD-10-CM | POA: Diagnosis not present

## 2021-07-18 DIAGNOSIS — M5116 Intervertebral disc disorders with radiculopathy, lumbar region: Secondary | ICD-10-CM | POA: Diagnosis not present

## 2021-07-18 DIAGNOSIS — Z4782 Encounter for orthopedic aftercare following scoliosis surgery: Secondary | ICD-10-CM | POA: Diagnosis not present

## 2021-07-18 DIAGNOSIS — Z4789 Encounter for other orthopedic aftercare: Secondary | ICD-10-CM | POA: Diagnosis not present

## 2021-07-18 DIAGNOSIS — I10 Essential (primary) hypertension: Secondary | ICD-10-CM | POA: Diagnosis not present

## 2021-07-18 DIAGNOSIS — M4316 Spondylolisthesis, lumbar region: Secondary | ICD-10-CM | POA: Diagnosis not present

## 2021-07-18 DIAGNOSIS — M4126 Other idiopathic scoliosis, lumbar region: Secondary | ICD-10-CM | POA: Diagnosis not present

## 2021-07-23 DIAGNOSIS — M4126 Other idiopathic scoliosis, lumbar region: Secondary | ICD-10-CM | POA: Diagnosis not present

## 2021-07-23 DIAGNOSIS — M5116 Intervertebral disc disorders with radiculopathy, lumbar region: Secondary | ICD-10-CM | POA: Diagnosis not present

## 2021-07-23 DIAGNOSIS — Z4789 Encounter for other orthopedic aftercare: Secondary | ICD-10-CM | POA: Diagnosis not present

## 2021-07-23 DIAGNOSIS — I10 Essential (primary) hypertension: Secondary | ICD-10-CM | POA: Diagnosis not present

## 2021-07-23 DIAGNOSIS — M4316 Spondylolisthesis, lumbar region: Secondary | ICD-10-CM | POA: Diagnosis not present

## 2021-07-23 DIAGNOSIS — Z4782 Encounter for orthopedic aftercare following scoliosis surgery: Secondary | ICD-10-CM | POA: Diagnosis not present

## 2021-07-30 DIAGNOSIS — Z4782 Encounter for orthopedic aftercare following scoliosis surgery: Secondary | ICD-10-CM | POA: Diagnosis not present

## 2021-07-30 DIAGNOSIS — M4126 Other idiopathic scoliosis, lumbar region: Secondary | ICD-10-CM | POA: Diagnosis not present

## 2021-07-30 DIAGNOSIS — I10 Essential (primary) hypertension: Secondary | ICD-10-CM | POA: Diagnosis not present

## 2021-07-30 DIAGNOSIS — Z4789 Encounter for other orthopedic aftercare: Secondary | ICD-10-CM | POA: Diagnosis not present

## 2021-07-30 DIAGNOSIS — M5116 Intervertebral disc disorders with radiculopathy, lumbar region: Secondary | ICD-10-CM | POA: Diagnosis not present

## 2021-07-30 DIAGNOSIS — M4316 Spondylolisthesis, lumbar region: Secondary | ICD-10-CM | POA: Diagnosis not present

## 2021-08-22 DIAGNOSIS — M48061 Spinal stenosis, lumbar region without neurogenic claudication: Secondary | ICD-10-CM | POA: Diagnosis not present

## 2021-08-22 DIAGNOSIS — R030 Elevated blood-pressure reading, without diagnosis of hypertension: Secondary | ICD-10-CM | POA: Diagnosis not present

## 2021-08-22 DIAGNOSIS — M4126 Other idiopathic scoliosis, lumbar region: Secondary | ICD-10-CM | POA: Diagnosis not present

## 2021-08-22 DIAGNOSIS — M5416 Radiculopathy, lumbar region: Secondary | ICD-10-CM | POA: Diagnosis not present

## 2021-08-22 DIAGNOSIS — L821 Other seborrheic keratosis: Secondary | ICD-10-CM | POA: Diagnosis not present

## 2021-08-22 DIAGNOSIS — Z23 Encounter for immunization: Secondary | ICD-10-CM | POA: Diagnosis not present

## 2021-08-22 DIAGNOSIS — L570 Actinic keratosis: Secondary | ICD-10-CM | POA: Diagnosis not present

## 2021-08-22 DIAGNOSIS — M545 Low back pain, unspecified: Secondary | ICD-10-CM | POA: Diagnosis not present

## 2021-08-22 DIAGNOSIS — M4316 Spondylolisthesis, lumbar region: Secondary | ICD-10-CM | POA: Diagnosis not present

## 2021-08-27 ENCOUNTER — Encounter: Payer: Self-pay | Admitting: Family Medicine

## 2021-08-27 DIAGNOSIS — I10 Essential (primary) hypertension: Secondary | ICD-10-CM

## 2021-08-27 MED ORDER — AMLODIPINE BESYLATE 2.5 MG PO TABS: 2.5000 mg | ORAL_TABLET | Freq: Every day | ORAL | 3 refills | Status: DC

## 2021-08-28 DIAGNOSIS — Z23 Encounter for immunization: Secondary | ICD-10-CM | POA: Diagnosis not present

## 2021-09-02 ENCOUNTER — Other Ambulatory Visit: Payer: Medicare Other

## 2021-09-10 ENCOUNTER — Encounter: Payer: Self-pay | Admitting: Family Medicine

## 2021-09-10 DIAGNOSIS — F419 Anxiety disorder, unspecified: Secondary | ICD-10-CM

## 2021-09-10 MED ORDER — ALPRAZOLAM 0.5 MG PO TABS: 0.5000 mg | ORAL_TABLET | Freq: Every evening | ORAL | 0 refills | Status: DC | PRN

## 2021-09-18 DIAGNOSIS — H524 Presbyopia: Secondary | ICD-10-CM | POA: Diagnosis not present

## 2021-09-18 DIAGNOSIS — H40013 Open angle with borderline findings, low risk, bilateral: Secondary | ICD-10-CM | POA: Diagnosis not present

## 2021-09-18 DIAGNOSIS — H25813 Combined forms of age-related cataract, bilateral: Secondary | ICD-10-CM | POA: Diagnosis not present

## 2021-09-26 DIAGNOSIS — Z23 Encounter for immunization: Secondary | ICD-10-CM | POA: Diagnosis not present

## 2021-10-08 DIAGNOSIS — M5416 Radiculopathy, lumbar region: Secondary | ICD-10-CM | POA: Diagnosis not present

## 2021-10-08 DIAGNOSIS — M48061 Spinal stenosis, lumbar region without neurogenic claudication: Secondary | ICD-10-CM | POA: Diagnosis not present

## 2021-10-08 DIAGNOSIS — M545 Low back pain, unspecified: Secondary | ICD-10-CM | POA: Diagnosis not present

## 2021-10-08 DIAGNOSIS — M4316 Spondylolisthesis, lumbar region: Secondary | ICD-10-CM | POA: Diagnosis not present

## 2021-10-08 DIAGNOSIS — M4126 Other idiopathic scoliosis, lumbar region: Secondary | ICD-10-CM | POA: Diagnosis not present

## 2021-10-09 ENCOUNTER — Encounter: Payer: Self-pay | Admitting: Family Medicine

## 2021-10-09 MED ORDER — MOLNUPIRAVIR EUA 200MG CAPSULE: 4.0000 | ORAL_CAPSULE | Freq: Two times a day (BID) | ORAL | 0 refills | Status: AC

## 2021-12-13 NOTE — Progress Notes (Addendum)
[facility] [address] [place], [place] [address] [phone]  Date:  12/17/2021   Name:  Tammy Leonard   DOB:  1946-06-16   MRN:  [id number]  PCP:  Darreld Mclean, MD    Chief Complaint: Yearly OV (Concerns/ questions: needs refill on Amlodipine )   History of Present Illness:  Tammy Leonard is a 76 y.o. very pleasant female patient who presents with the following:  Patient seen today for medicare "physical exam"- History of skin cancer, hypertension, spinal stenosis, diverticulitis, pre-diabetes Her husband [name] passed away very recently due to recurrent throat cancer-he died last month  Most recent visit with myself was about 1 year ago  Zulay generally enjoys playing tennis and golf.  She has had some issues with her back and does see Dr. Vertell Limber with neurosurgery She had surgery in September.  She does feel like her aftercare was not ideal as she was caring for her husband-however overall she is significantly improved She is back doing some golf and tennis  She is seeing [facility] again next week for some follow-up Hospital Course: The patient was admitted on 06/26/2021 and taken to the operating room where the patient underwent left-sided XL IF L3-4 and L4-5 levels with percutaneous pedicle screw fixation. The patient tolerated the procedure well and was taken to the recovery room and then to the floor in stable condition. The hospital course was routine. There were no complications. The wound remained clean dry and intact. Pt had appropriate back soreness. No complaints of leg pain or new N/T/W. The patient remained afebrile with stable vital signs, and tolerated a regular diet. The patient continued to increase activities, and pain was well controlled with oral pain medications.  COVID-19 booster- done  Due for colon cancer screening- overdue.  Will order for her today, she is a patient of Dr.  Earlean Shawl Mammogram is up-to-date Bone density up-to-date BMP and CBC done in September- she is not quite fasting   Tammy Leonard notes she is staying busy with family and friends.  She is of course the morning the loss of her beloved husband but does not feel that she is depressed.  She has alprazolam which she has used on rare occasion.  Right now she does not wish to have any other medication.  She is not doing formal counseling but will keep this in mind as time passes Patient Active Problem List   Diagnosis Date Noted   Spondylolisthesis of lumbar region 06/26/2021   History of chlamydia 04/09/2021   Family history of breast cancer 04/09/2021   Prediabetes 12/01/2020   Lumbar canal stenosis 08/04/2018   Osteopenia 02/04/2018   Squamous cell carcinoma in situ (SCCIS) of skin 01/05/2018   History of diverticulitis 12/16/2016   History of cryosurgery 08/28/2016   Elevated blood pressure 08/22/2013    Past Medical History:  Diagnosis Date   Abnormal Pap smear of cervix    Anxiety    Chlamydia    h/o   Diverticulitis    Hypertension    Osteopenia    hip and spine   Renal cyst    stable CT 5/11 [place] 10/12   SCC (squamous cell carcinoma), arm, left; hand, left 12/2017    Past Surgical History:  Procedure Laterality Date   ANTERIOR LAT LUMBAR FUSION Left 06/26/2021   Procedure: Left Lumbar 3-4, Lumbar 4-5 Anterior lateral lumbar interbody fusion with percutaneous pedicle screw fixation;  Surgeon: Erline Levine, MD;  Location: [facility];  Service: Neurosurgery;  Laterality: Left;   COLONOSCOPY     EYE SURGERY     GYNECOLOGIC CRYOSURGERY     LUMBAR PERCUTANEOUS PEDICLE SCREW 2 LEVEL N/A 06/26/2021   Procedure: LUMBAR PERCUTANEOUS PEDICLE SCREW 2 LEVEL;  Surgeon: Erline Levine, MD;  Location: [facility];  Service: Neurosurgery;  Laterality: N/A;   WISDOM TOOTH EXTRACTION      Social History   Tobacco Use   Smoking status: Never   Smokeless tobacco: Never  Vaping Use   Vaping Use: Never used   Substance Use Topics   Alcohol use: Yes    Alcohol/week: 7.0 standard drinks    Types: 7 Standard drinks or equivalent per week    Comment: glass and a half of wine daily   Drug use: No    Family History  Problem Relation Age of Onset   Heart disease Mother    Breast cancer Mother 66       and stomach cancer   Mitral valve prolapse Mother    Heart disease Father    Prostate cancer Father    Depression Daughter     Allergies  Allergen Reactions   Prochlorperazine Edisylate Anxiety and Other (See Comments)   Lisinopril     Patient had a respiratory illness with cough and change made in her blood pressure medication. She did not have an allergic reaction or angioedema    Medication list has been reviewed and updated.  Current Outpatient Medications on File Prior to Visit  Medication Sig Dispense Refill   ALPRAZolam (XANAX) 0.5 MG tablet Take 1 tablet (0.5 mg total) by mouth at bedtime as needed. 30 tablet 0   amLODipine (NORVASC) 2.5 MG tablet Take 1 tablet (2.5 mg total) by mouth daily. 90 tablet 3   B Complex Vitamins (VITAMIN B COMPLEX PO) Take 1 tablet by mouth daily.     CALCIUM PO Take 1 tablet by mouth daily.     Multiple Vitamins-Minerals (MULTIVITAMIN PO) Take 1 tablet by mouth daily.     UNABLE TO FIND Take 1 tablet by mouth daily. Mag R & R (muscle recovery & sleep support     No current facility-administered medications on file prior to visit.    Review of Systems:  As per HPI- otherwise negative. BP Readings from Last 3 Encounters:  12/17/21 (!) 162/70  06/27/21 (!) 145/47  06/20/21 (!) 188/86     Physical Examination: Vitals:   12/17/21 0940  BP: (!) 162/70  Pulse: 86  Resp: 18  Temp: 98.5 F (36.9 C)  SpO2: 97%   Vitals:   12/17/21 0940  Weight: 128 lb (58.1 kg)   Body mass index is 24.59 kg/m. Ideal Body Weight:    GEN: no acute distress. HEENT: Atraumatic, Normocephalic.  Ears and Nose: No external deformity. CV: RRR, No M/G/R. No  JVD. No thrill. No extra heart sounds. PULM: CTA B, no wheezes, crackles, rhonchi. No retractions. No resp. distress. No accessory muscle use. ABD: S, NT, ND, +BS. No rebound. No HSM. EXTR: No c/c/e PSYCH: Normally interactive. Conversant.    Assessment and Plan: Prediabetes - Plan: Comprehensive metabolic panel, Hemoglobin A1c  Essential hypertension, benign - Plan: Hemoglobin A1c, amLODipine (NORVASC) 5 MG tablet  Estrogen deficiency  Screening for thyroid disorder - Plan: TSH  Vitamin D deficiency - Plan: VITAMIN D 25 Hydroxy (Vit-D Deficiency, Fractures)  Screening for hyperlipidemia - Plan: Lipid panel  Colon cancer screening - Plan: Ambulatory  referral to Gastroenterology  Feeling grief   Wellness visit today.  Encouraged healthy diet and exercise routine-Mery tends to be physically active. Referral placed to GI Routine labs are pending as above, we will be in touch with patient pending results We talked about the loss of her husband and remembered [name] together. He will certainly be missed Signed Lamar Blinks, MD  Received labs as below, message to patient Results for orders placed or performed in visit on 12/17/21  Comprehensive metabolic panel  Result Value Ref Range   Sodium 138 135 - 145 mEq/L   Potassium 4.4 3.5 - 5.1 mEq/L   Chloride 99 96 - 112 mEq/L   CO2 28 19 - 32 mEq/L   Glucose, Bld 101 (H) 70 - 99 mg/dL   BUN 21 6 - 23 mg/dL   Creatinine, Ser 0.79 0.40 - 1.20 mg/dL   Total Bilirubin 0.6 0.2 - 1.2 mg/dL   Alkaline Phosphatase 54 39 - 117 U/L   AST 33 0 - 37 U/L   ALT 38 (H) 0 - 35 U/L   Total Protein 7.5 6.0 - 8.3 g/dL   Albumin 5.0 3.5 - 5.2 g/dL   GFR 74.00 >60.00 mL/min   Calcium 10.8 (H) 8.4 - 10.5 mg/dL  Hemoglobin A1c  Result Value Ref Range   Hgb A1c MFr Bld 5.9 4.6 - 6.5 %  Lipid panel  Result Value Ref Range   Cholesterol 232 (H) 0 - 200 mg/dL   Triglycerides 74.0 0.0 - 149.0 mg/dL   HDL 137.40 >39.00 mg/dL   VLDL 14.8 0.0 -  40.0 mg/dL   LDL Cholesterol 80 0 - 99 mg/dL   Total CHOL/HDL Ratio 2    NonHDL 95.02   TSH  Result Value Ref Range   TSH 0.79 0.35 - 5.50 uIU/mL  VITAMIN D 25 Hydroxy (Vit-D Deficiency, Fractures)  Result Value Ref Range   VITD 30.59 30.00 - 100.00 ng/mL

## 2021-12-13 NOTE — Patient Instructions (Addendum)
It was great to see you again today, I will be in touch with your labs ? ?I am so sorry for the loss of [name]- it was great to talk about him with your today!   ? ?Please do get in touch with Dr Earlean Shawl and set up your colon- I placed a referral for you but please do give him a call [phone] ? ?Let's try increasing your amlodipine to 5 mg- let me know if any concerns with this increased dose  ? ? ?

## 2021-12-17 ENCOUNTER — Encounter: Payer: Self-pay | Admitting: Family Medicine

## 2021-12-17 ENCOUNTER — Ambulatory Visit (INDEPENDENT_AMBULATORY_CARE_PROVIDER_SITE_OTHER): Payer: Medicare Other | Admitting: Family Medicine

## 2021-12-17 VITALS — BP 162/70 | HR 86 | Temp 98.5°F | Resp 18 | Wt 128.0 lb

## 2021-12-17 DIAGNOSIS — E559 Vitamin D deficiency, unspecified: Secondary | ICD-10-CM | POA: Diagnosis not present

## 2021-12-17 DIAGNOSIS — I10 Essential (primary) hypertension: Secondary | ICD-10-CM

## 2021-12-17 DIAGNOSIS — E2839 Other primary ovarian failure: Secondary | ICD-10-CM

## 2021-12-17 DIAGNOSIS — Z1211 Encounter for screening for malignant neoplasm of colon: Secondary | ICD-10-CM | POA: Diagnosis not present

## 2021-12-17 DIAGNOSIS — R7303 Prediabetes: Secondary | ICD-10-CM | POA: Diagnosis not present

## 2021-12-17 DIAGNOSIS — F4321 Adjustment disorder with depressed mood: Secondary | ICD-10-CM

## 2021-12-17 DIAGNOSIS — Z1329 Encounter for screening for other suspected endocrine disorder: Secondary | ICD-10-CM | POA: Diagnosis not present

## 2021-12-17 DIAGNOSIS — Z13220 Encounter for screening for lipoid disorders: Secondary | ICD-10-CM

## 2021-12-17 LAB — COMPREHENSIVE METABOLIC PANEL
ALT: 38 U/L — ABNORMAL HIGH (ref 0–35)
AST: 33 U/L (ref 0–37)
Albumin: 5 g/dL (ref 3.5–5.2)
Alkaline Phosphatase: 54 U/L (ref 39–117)
BUN: 21 mg/dL (ref 6–23)
CO2: 28 mEq/L (ref 19–32)
Calcium: 10.8 mg/dL — ABNORMAL HIGH (ref 8.4–10.5)
Chloride: 99 mEq/L (ref 96–112)
Creatinine, Ser: 0.79 mg/dL (ref 0.40–1.20)
GFR: 74 mL/min (ref >60.00)
Glucose, Bld: 101 mg/dL — ABNORMAL HIGH (ref 70–99)
Potassium: 4.4 mEq/L (ref 3.5–5.1)
Sodium: 138 mEq/L (ref 135–145)
Total Bilirubin: 0.6 mg/dL (ref 0.2–1.2)
Total Protein: 7.5 g/dL (ref 6.0–8.3)

## 2021-12-17 LAB — LIPID PANEL
Cholesterol: 232 mg/dL — ABNORMAL HIGH (ref 0–200)
HDL: 137.4 mg/dL (ref >39.00)
LDL Cholesterol: 80 mg/dL (ref 0–99)
NonHDL: 95.02
Total CHOL/HDL Ratio: 2
Triglycerides: 74 mg/dL (ref 0.0–149.0)
VLDL: 14.8 mg/dL (ref 0.0–40.0)

## 2021-12-17 LAB — TSH: TSH: 0.79 u[IU]/mL (ref 0.35–5.50)

## 2021-12-17 LAB — VITAMIN D 25 HYDROXY (VIT D DEFICIENCY, FRACTURES): VITD: 30.59 ng/mL (ref 30.00–100.00)

## 2021-12-17 LAB — HEMOGLOBIN A1C: Hgb A1c MFr Bld: 5.9 % (ref 4.6–6.5)

## 2021-12-17 MED ORDER — AMLODIPINE BESYLATE 5 MG PO TABS: 5.0000 mg | ORAL_TABLET | Freq: Every day | ORAL | 3 refills | Status: DC

## 2021-12-17 NOTE — Addendum Note (Signed)
Addended by: Lamar Blinks C on: 12/17/2021 07:50 PM ? ? Modules accepted: Orders ? ?

## 2022-01-14 DIAGNOSIS — D235 Other benign neoplasm of skin of trunk: Secondary | ICD-10-CM | POA: Diagnosis not present

## 2022-01-14 DIAGNOSIS — Z85828 Personal history of other malignant neoplasm of skin: Secondary | ICD-10-CM | POA: Diagnosis not present

## 2022-01-14 DIAGNOSIS — L578 Other skin changes due to chronic exposure to nonionizing radiation: Secondary | ICD-10-CM | POA: Diagnosis not present

## 2022-01-14 DIAGNOSIS — D2371 Other benign neoplasm of skin of right lower limb, including hip: Secondary | ICD-10-CM | POA: Diagnosis not present

## 2022-01-14 DIAGNOSIS — L719 Rosacea, unspecified: Secondary | ICD-10-CM | POA: Diagnosis not present

## 2022-01-14 DIAGNOSIS — L821 Other seborrheic keratosis: Secondary | ICD-10-CM | POA: Diagnosis not present

## 2022-01-14 DIAGNOSIS — L570 Actinic keratosis: Secondary | ICD-10-CM | POA: Diagnosis not present

## 2022-01-14 DIAGNOSIS — D225 Melanocytic nevi of trunk: Secondary | ICD-10-CM | POA: Diagnosis not present

## 2022-01-24 DIAGNOSIS — M25531 Pain in right wrist: Secondary | ICD-10-CM | POA: Diagnosis not present

## 2022-02-02 DIAGNOSIS — S60211A Contusion of right wrist, initial encounter: Secondary | ICD-10-CM | POA: Diagnosis not present

## 2022-02-02 DIAGNOSIS — M25531 Pain in right wrist: Secondary | ICD-10-CM | POA: Diagnosis not present

## 2022-02-02 DIAGNOSIS — S40011A Contusion of right shoulder, initial encounter: Secondary | ICD-10-CM | POA: Diagnosis not present

## 2022-02-02 DIAGNOSIS — M25511 Pain in right shoulder: Secondary | ICD-10-CM | POA: Diagnosis not present

## 2022-02-09 ENCOUNTER — Other Ambulatory Visit (INDEPENDENT_AMBULATORY_CARE_PROVIDER_SITE_OTHER): Payer: Medicare Other

## 2022-02-10 ENCOUNTER — Encounter: Payer: Self-pay | Admitting: Family Medicine

## 2022-02-10 LAB — PTH, INTACT AND CALCIUM
Calcium: 9.8 mg/dL (ref 8.6–10.4)
PTH: 34 pg/mL (ref 16–77)

## 2022-03-05 ENCOUNTER — Encounter: Payer: Self-pay | Admitting: Family Medicine

## 2022-04-20 ENCOUNTER — Other Ambulatory Visit: Payer: Self-pay | Admitting: Obstetrics & Gynecology

## 2022-04-20 DIAGNOSIS — Z1231 Encounter for screening mammogram for malignant neoplasm of breast: Secondary | ICD-10-CM

## 2022-05-06 DIAGNOSIS — M5416 Radiculopathy, lumbar region: Secondary | ICD-10-CM | POA: Diagnosis not present

## 2022-05-06 DIAGNOSIS — M4126 Other idiopathic scoliosis, lumbar region: Secondary | ICD-10-CM | POA: Diagnosis not present

## 2022-05-06 DIAGNOSIS — M7918 Myalgia, other site: Secondary | ICD-10-CM | POA: Diagnosis not present

## 2022-05-14 ENCOUNTER — Ambulatory Visit (INDEPENDENT_AMBULATORY_CARE_PROVIDER_SITE_OTHER): Payer: Medicare Other | Admitting: Sports Medicine

## 2022-05-14 ENCOUNTER — Ambulatory Visit: Payer: Self-pay

## 2022-05-14 VITALS — BP 180/66 | Ht 61.0 in | Wt 127.0 lb

## 2022-05-14 DIAGNOSIS — S76302A Unspecified injury of muscle, fascia and tendon of the posterior muscle group at thigh level, left thigh, initial encounter: Secondary | ICD-10-CM

## 2022-05-14 DIAGNOSIS — S76312A Strain of muscle, fascia and tendon of the posterior muscle group at thigh level, left thigh, initial encounter: Secondary | ICD-10-CM | POA: Insufficient documentation

## 2022-05-14 NOTE — Assessment & Plan Note (Signed)
WE will start on Askling exercises Heel lifts Use HS compression Reck in 4 weeks If not improving will do [place] and possibly formal PT

## 2022-05-14 NOTE — Progress Notes (Signed)
Left HS pain  2 weeks ago felt a pull in left HS while playing tennis Less sever but still persists Comes for check At first tight and hurt into buttocks Now primarily hurts with running  Had RT HS injury in 2018  Since then has had lumbar fusion for spondylolithesis  PE [place] F in NAD BP (!) 180/66   Ht '5\' 1"'$  (1.549 m)   Wt 127 lb (57.6 kg)   LMP 10/13/1999   BMI 24.00 kg/m    Left HS with no TTP today No defect Strength is moderately decreased  tight on H Test No back pain with testing

## 2022-05-18 ENCOUNTER — Ambulatory Visit
Admission: RE | Admit: 2022-05-18 | Discharge: 2022-05-18 | Disposition: A | Discharge status: Home / Self Care | Payer: Medicare Other | Source: Ambulatory Visit | Attending: Obstetrics & Gynecology | Admitting: Obstetrics & Gynecology

## 2022-05-18 DIAGNOSIS — Z1231 Encounter for screening mammogram for malignant neoplasm of breast: Secondary | ICD-10-CM | POA: Diagnosis not present

## 2022-05-28 DIAGNOSIS — H40013 Open angle with borderline findings, low risk, bilateral: Secondary | ICD-10-CM | POA: Diagnosis not present

## 2022-05-28 DIAGNOSIS — H25813 Combined forms of age-related cataract, bilateral: Secondary | ICD-10-CM | POA: Diagnosis not present

## 2022-05-28 DIAGNOSIS — H524 Presbyopia: Secondary | ICD-10-CM | POA: Diagnosis not present

## 2022-06-11 ENCOUNTER — Ambulatory Visit (INDEPENDENT_AMBULATORY_CARE_PROVIDER_SITE_OTHER): Payer: Medicare Other | Admitting: Sports Medicine

## 2022-06-11 DIAGNOSIS — S76312A Strain of muscle, fascia and tendon of the posterior muscle group at thigh level, left thigh, initial encounter: Secondary | ICD-10-CM | POA: Diagnosis not present

## 2022-06-11 NOTE — Progress Notes (Addendum)
PCP: Copland, Gay Filler, MD  Subjective:   HPI: Patient is a 76 y.o. female here for left hamstring injury.  Feels improved today Doing the Askling exercises daily Has not been participating in tennis/golf since last visit 8/3 Has been doing deep water aerobic resistance training without pain or difficulty Does not yet feel ready to get back into tennis/golf, but will trial a small amount next weekend possibly    Past Medical History:  Diagnosis Date   Abnormal Pap smear of cervix    Anxiety    Chlamydia    h/o   Diverticulitis    Hypertension    Osteopenia    hip and spine   Renal cyst    stable CT 5/11 [place] 10/12   SCC (squamous cell carcinoma), arm, left; hand, left 12/2017    Current Outpatient Medications on File Prior to Visit  Medication Sig Dispense Refill   ALPRAZolam (XANAX) 0.5 MG tablet Take 1 tablet (0.5 mg total) by mouth at bedtime as needed. 30 tablet 0   amLODipine (NORVASC) 5 MG tablet Take 1 tablet (5 mg total) by mouth daily. 90 tablet 3   B Complex Vitamins (VITAMIN B COMPLEX PO) Take 1 tablet by mouth daily.     CALCIUM PO Take 1 tablet by mouth daily.     Multiple Vitamins-Minerals (MULTIVITAMIN PO) Take 1 tablet by mouth daily.     UNABLE TO FIND Take 1 tablet by mouth daily. Mag R & R (muscle recovery & sleep support     No current facility-administered medications on file prior to visit.    Past Surgical History:  Procedure Laterality Date   ANTERIOR LAT LUMBAR FUSION Left 06/26/2021   Procedure: Left Lumbar 3-4, Lumbar 4-5 Anterior lateral lumbar interbody fusion with percutaneous pedicle screw fixation;  Surgeon: Erline Levine, MD;  Location: [facility];  Service: Neurosurgery;  Laterality: Left;   COLONOSCOPY     EYE SURGERY     GYNECOLOGIC CRYOSURGERY     LUMBAR PERCUTANEOUS PEDICLE SCREW 2 LEVEL N/A 06/26/2021   Procedure: LUMBAR PERCUTANEOUS PEDICLE SCREW 2 LEVEL;  Surgeon: Erline Levine, MD;  Location: [facility];  Service: Neurosurgery;   Laterality: N/A;   WISDOM TOOTH EXTRACTION      Allergies  Allergen Reactions   Prochlorperazine Edisylate Anxiety and Other (See Comments)   Lisinopril     Patient had a respiratory illness with cough and change made in her blood pressure medication. She did not have an allergic reaction or angioedema    BP (!) 171/66   LMP 10/13/1999       No data to display              No data to display              Objective:  Physical Exam:  Gen: NAD, comfortable in exam room  Left hip/leg: Improved ROM with hip flexion in straight leg raise to 70 degrees. FABER equivalent bilaterally. Strength improved today on H test (left better than right)   Assessment & Plan:  1. Left hamstring strain Improving. Start to incorporate walking, then light golf. Also encouraged to continue water exercises. Continue Askling exercises Use HS compression Follow-up in clinic in 6 weeks  I observed and examined the patient with the resident and agree with assessment and plan.  Note reviewed and modified by me. Ila Mcgill, MD

## 2022-06-11 NOTE — Assessment & Plan Note (Signed)
Good improvement in symptoms  Exam today is non tender with better H test and better strength  Reck 6 weeks and on that visit do dynamic tests with hop, jog

## 2022-06-11 NOTE — Patient Instructions (Signed)
Great to see you today.  Continue your water exercises and start incorporating some walking. Continue the Askling exercises as well  Follow-up back in the clinic with [place] in 6 weeks

## 2022-08-04 ENCOUNTER — Ambulatory Visit (INDEPENDENT_AMBULATORY_CARE_PROVIDER_SITE_OTHER): Payer: Medicare Other | Admitting: Sports Medicine

## 2022-08-04 VITALS — BP 140/60 | Ht 61.0 in | Wt 125.0 lb

## 2022-08-04 DIAGNOSIS — S76312A Strain of muscle, fascia and tendon of the posterior muscle group at thigh level, left thigh, initial encounter: Secondary | ICD-10-CM | POA: Diagnosis not present

## 2022-08-04 NOTE — Assessment & Plan Note (Signed)
Patient continues to have improvement of her hamstring strain.  She may continue with golf and slowly ease back into tennis.  She may want to wear the compression sleeve with all sport activity.  She should continue Askling exercises bilaterally and use heel lifts in her hiking boots.  New heel was provided today. I anticipate she will do very well and should be able to return to both golf and tennis.  She should return to clinic as needed.

## 2022-08-04 NOTE — Progress Notes (Signed)
   Established Patient Office Visit  Subjective   Patient ID: Tammy Leonard, female    DOB: 01/22/1946  Age: 76 y.o. MRN: [id number]  Follow-up left hamstring strain.  Tammy Leonard presents today for follow-up of left hamstring strain.  She reports she has continued to improve.  She is back to golf she has tried putting and walking 9 holes with no reproduction of her pain.  There is today she has plans to walk and play a full 18 holes.  She has been using the compression sleeve with walking and vigorous exercise.  She does continue water aerobics at the pool she was using has closed for the season. She reports she has no discomfort with walking and playing golf.  She is currently walking about 1 to 2 miles a day.  She does have some tightness in her hamstrings.  She does report some trepidation with returning to tennis as this is how she was injured in the past.  She may very slowly begin to play however she has plans for a 195 mile 2-week hike in [place] in May.  She is concerned she may injure herself in tennis and not be able to complete this trip so she would like to go easy with that for now.  She denies any new injuries or bruising to the area.   ROS as listed above in HPI    Objective:     BP (!) 140/60   Ht '5\' 1"'$  (1.549 m)   Wt 125 lb (56.7 kg)   LMP 10/13/1999   BMI 23.62 kg/m   Physical Exam Vitals reviewed.  Constitutional:      General: She is not in acute distress.    Appearance: Normal appearance. She is not ill-appearing, toxic-appearing or diaphoretic.  HENT:     Head: Normocephalic.  Pulmonary:     Effort: Pulmonary effort is normal.  Neurological:     Mental Status: She is alert.   Left hamstring: No obvious deformity or asymmetry.  She does have some slight tenderness to palpation at the proximal lateral hamstring as well as the ischial tuberosity.  No palpable muscular defect. FROM flexion and extension. Strength 5/5 resisted knee flexion, easily  fatigues and prone to cramping sensation. Normal gait.      Assessment & Plan:   Problem List Items Addressed This Visit       Musculoskeletal and Integument   Hamstring strain, left, initial encounter - Primary    Patient continues to have improvement of her hamstring strain.  She may continue with golf and slowly ease back into tennis.  She may want to wear the compression sleeve with all sport activity.  She should continue Askling exercises bilaterally and use heel lifts in her hiking boots.  New heel was provided today. I anticipate she will do very well and should be able to return to both golf and tennis.  She should return to clinic as needed.         Return if symptoms worsen or fail to improve.    Elmore Guise, DO  I observed and examined the patient with the [facility] resident and agree with assessment and plan.  Note reviewed and modified by me. Ila Mcgill, MD

## 2022-08-27 ENCOUNTER — Encounter: Payer: Self-pay | Admitting: Family Medicine

## 2022-08-27 ENCOUNTER — Ambulatory Visit (INDEPENDENT_AMBULATORY_CARE_PROVIDER_SITE_OTHER): Payer: Medicare Other | Admitting: Family Medicine

## 2022-08-27 VITALS — BP 162/62 | HR 72 | Temp 98.2°F | Resp 18 | Ht 61.0 in | Wt 129.6 lb

## 2022-08-27 DIAGNOSIS — R7303 Prediabetes: Secondary | ICD-10-CM | POA: Diagnosis not present

## 2022-08-27 DIAGNOSIS — I10 Essential (primary) hypertension: Secondary | ICD-10-CM | POA: Diagnosis not present

## 2022-08-27 DIAGNOSIS — F4321 Adjustment disorder with depressed mood: Secondary | ICD-10-CM

## 2022-08-27 DIAGNOSIS — R233 Spontaneous ecchymoses: Secondary | ICD-10-CM

## 2022-08-27 LAB — COMPREHENSIVE METABOLIC PANEL
ALT: 24 U/L (ref 0–35)
AST: 31 U/L (ref 0–37)
Albumin: 4.8 g/dL (ref 3.5–5.2)
Alkaline Phosphatase: 57 U/L (ref 39–117)
BUN: 20 mg/dL (ref 6–23)
CO2: 28 mEq/L (ref 19–32)
Calcium: 10.1 mg/dL (ref 8.4–10.5)
Chloride: 98 mEq/L (ref 96–112)
Creatinine, Ser: 0.85 mg/dL (ref 0.40–1.20)
GFR: 67.44 mL/min (ref >60.00)
Glucose, Bld: 108 mg/dL — ABNORMAL HIGH (ref 70–99)
Potassium: 4.7 mEq/L (ref 3.5–5.1)
Sodium: 135 mEq/L (ref 135–145)
Total Bilirubin: 0.4 mg/dL (ref 0.2–1.2)
Total Protein: 7.4 g/dL (ref 6.0–8.3)

## 2022-08-27 LAB — CBC
HCT: 44.5 % (ref 36.0–46.0)
Hemoglobin: 14.7 g/dL (ref 12.0–15.0)
MCHC: 33.1 g/dL (ref 30.0–36.0)
MCV: 93.2 fl (ref 78.0–100.0)
Platelets: 352 10*3/uL (ref 150.0–400.0)
RBC: 4.78 Mil/uL (ref 3.87–5.11)
RDW: 14 % (ref 11.5–15.5)
WBC: 5.7 10*3/uL (ref 4.0–10.5)

## 2022-08-27 LAB — HEMOGLOBIN A1C: Hgb A1c MFr Bld: 6.2 % (ref 4.6–6.5)

## 2022-08-27 NOTE — Patient Instructions (Addendum)
It was very nice to see you again today, I will be in touch with your labs asap  Recommend the latest COVID-19 booster, dose of RSV if not done already and your flu shot once you are over your cold  Continue to keep an eye on your blood pressures- if you are consistently higher than 140/85 let me know and we can adjust your medication  Assuming all is well, lets check back in about 6 months

## 2022-08-27 NOTE — Progress Notes (Signed)
[place] at [facility] [address] [place], [place] [address] [phone]  Date:  08/27/2022   Name:  Tammy Leonard   DOB:  1946/04/13   MRN:  [id number]  PCP:  Darreld Mclean, MD    Chief Complaint: 6 month follow up (Concerns/ questions: 1. pt has been checking her BP at home and getting some moderate readings 2. Bruising. 3. Inquires about CT cardiac Score/Flu shot today: will discuss since she is not feeling all to well /AWV due)   History of Present Illness:  Tammy Leonard is a 76 y.o. very pleasant female patient who presents with the following:  Patient seen today for follow-up visit- History of skin cancer, hypertension, spinal stenosis, diverticulitis, pre-diabetes   Most recently seen by myself in March of this year-at that time her husband Tammy Leonard had recently passed away from throat cancer At her last visit Tammy Leonard was focusing on staying busy, she did not feel that she was experiencing depression Tammy Leonard continues to deal that she is holding up pretty well- she is grieving but not depressed She is also recovering from a wrist injury - she was out from tennis for several months but is finally getting back.  She is also back playing some golf She is planning to to the [employer] [place] trail in [place] this coming May!  Is is nearly 200 miles over 2 weeks She got clearance from her sports medicine doctor, she plans to start training in the next month or 2  She does note that she tends to bruise more easily recently.  However, no nosebleeds or other abnormal bleeding noted  Colon cancer screening- she is aware and will set this up  Recommend COVID booster, RSV Flu shot- she will do once she is well  Labs done in March-CMP, lipid, vitamin D, A1c, thyroid Mammogram completed in August DEXA scan 1 year ago  Alprazolam as needed, has not filled in a year Amlodipine 5 mg  She has been checking her  BP at home some- it has been more up and down over the last month or so  Over the last 10 days running 124- 143/68- 72  She would like to do a coronary calcium  She has a mild cold right now - she tested for covid and was negative twice  Patient Active Problem List   Diagnosis Date Noted   Hamstring strain, left, initial encounter 05/14/2022   Spondylolisthesis of lumbar region 06/26/2021   History of chlamydia 04/09/2021   Family history of breast cancer 04/09/2021   Prediabetes 12/01/2020   Lumbar canal stenosis 08/04/2018   Osteopenia 02/04/2018   Squamous cell carcinoma in situ (SCCIS) of skin 01/05/2018   History of diverticulitis 12/16/2016   History of cryosurgery 08/28/2016   Elevated blood pressure 08/22/2013    Past Medical History:  Diagnosis Date   Abnormal Pap smear of cervix    Anxiety    Chlamydia    h/o   Diverticulitis    Hypertension    Osteopenia    hip and spine   Renal cyst    stable CT 5/11 [place] 10/12   SCC (squamous cell carcinoma), arm, left; hand, left 12/2017    Past Surgical History:  Procedure Laterality Date   ANTERIOR LAT LUMBAR FUSION Left 06/26/2021   Procedure: Left Lumbar 3-4, Lumbar 4-5 Anterior lateral lumbar interbody fusion with percutaneous pedicle screw fixation;  Surgeon: Erline Levine, MD;  Location: [place] [facility] OR;  Service: Neurosurgery;  Laterality: Left;   COLONOSCOPY     EYE SURGERY     GYNECOLOGIC CRYOSURGERY     LUMBAR PERCUTANEOUS PEDICLE SCREW 2 LEVEL N/A 06/26/2021   Procedure: LUMBAR PERCUTANEOUS PEDICLE SCREW 2 LEVEL;  Surgeon: Erline Levine, MD;  Location: [facility];  Service: Neurosurgery;  Laterality: N/A;   WISDOM TOOTH EXTRACTION      Social History   Tobacco Use   Smoking status: Never   Smokeless tobacco: Never  Vaping Use   Vaping Use: Never used  Substance Use Topics   Alcohol use: Yes    Alcohol/week: 7.0 standard drinks of alcohol    Types: 7 Standard drinks or equivalent per week    Comment: glass and a half  of wine daily   Drug use: No    Family History  Problem Relation Age of Onset   Heart disease Mother    Breast cancer Mother 17       and stomach cancer   Mitral valve prolapse Mother    Heart disease Father    Prostate cancer Father    Depression Daughter     Allergies  Allergen Reactions   Prochlorperazine Edisylate Anxiety and Other (See Comments)   Lisinopril     Patient had a respiratory illness with cough and change made in her blood pressure medication. She did not have an allergic reaction or angioedema    Medication list has been reviewed and updated.  Current Outpatient Medications on File Prior to Visit  Medication Sig Dispense Refill   ALPRAZolam (XANAX) 0.5 MG tablet Take 1 tablet (0.5 mg total) by mouth at bedtime as needed. 30 tablet 0   amLODipine (NORVASC) 5 MG tablet Take 1 tablet (5 mg total) by mouth daily. 90 tablet 3   B Complex Vitamins (VITAMIN B COMPLEX PO) Take 1 tablet by mouth daily.     CALCIUM PO Take 1 tablet by mouth daily.     Multiple Vitamins-Minerals (MULTIVITAMIN PO) Take 1 tablet by mouth daily.     UNABLE TO FIND Take 1 tablet by mouth daily. Mag R & R (muscle recovery & sleep support     No current facility-administered medications on file prior to visit.    Review of Systems:  As per HPI- otherwise negative.   Physical Examination: Vitals:   08/27/22 1041  BP: (!) 162/62  Pulse: 72  Resp: 18  Temp: 98.2 F (36.8 C)  SpO2: 98%   Vitals:   08/27/22 1041  Weight: 129 lb 9.6 oz (58.8 kg)  Height: '5\' 1"'$  (1.549 m)   Body mass index is 24.49 kg/m. Ideal Body Weight: Weight in (lb) to have BMI = 25: 132  GEN: no acute distress.  Normal weight, looks well HEENT: Atraumatic, Normocephalic.  Ears and Nose: No external deformity. CV: RRR, No M/G/R. No JVD. No thrill. No extra heart sounds. PULM: CTA B, no wheezes, crackles, rhonchi. No retractions. No resp. distress. No accessory muscle use. ABD: S, NT, ND, +BS. No rebound.  No HSM. EXTR: No c/c/e PSYCH: Normally interactive. Conversant.    Assessment and Plan: Essential hypertension, benign - Plan: CBC, Comprehensive metabolic panel, CT CARDIAC SCORING (SELF PAY ONLY)  Prediabetes - Plan: Hemoglobin A1c, CT CARDIAC SCORING (SELF PAY ONLY)  Feeling grief  Easy bruising - Plan: CBC  Following up today.  She is interested in a coronary calcium score which is ordered.  Blood pressure is elevated in clinic, however she is checking  it regularly at home and recent readings have been in normal range.  Continue amlodipine, I asked her to continue monitoring her blood pressures  Follow-up on prediabetes today  Discussed easy bruising.  She has some thinning of her skin due to aging.  This is likely why she has been bruising more easily, we will certainly check a CBC  Signed Lamar Blinks, MD  Received labs as below, message to patient Results for orders placed or performed in visit on 08/27/22  CBC  Result Value Ref Range   WBC 5.7 4.0 - 10.5 K/uL   RBC 4.78 3.87 - 5.11 Mil/uL   Platelets 352.0 150.0 - 400.0 K/uL   Hemoglobin 14.7 12.0 - 15.0 g/dL   HCT 44.5 36.0 - 46.0 %   MCV 93.2 78.0 - 100.0 fl   MCHC 33.1 30.0 - 36.0 g/dL   RDW 14.0 11.5 - 15.5 %  Comprehensive metabolic panel  Result Value Ref Range   Sodium 135 135 - 145 mEq/L   Potassium 4.7 3.5 - 5.1 mEq/L   Chloride 98 96 - 112 mEq/L   CO2 28 19 - 32 mEq/L   Glucose, Bld 108 (H) 70 - 99 mg/dL   BUN 20 6 - 23 mg/dL   Creatinine, Ser 0.85 0.40 - 1.20 mg/dL   Total Bilirubin 0.4 0.2 - 1.2 mg/dL   Alkaline Phosphatase 57 39 - 117 U/L   AST 31 0 - 37 U/L   ALT 24 0 - 35 U/L   Total Protein 7.4 6.0 - 8.3 g/dL   Albumin 4.8 3.5 - 5.2 g/dL   GFR 67.44 >60.00 mL/min   Calcium 10.1 8.4 - 10.5 mg/dL  Hemoglobin A1c  Result Value Ref Range   Hgb A1c MFr Bld 6.2 4.6 - 6.5 %

## 2022-08-28 ENCOUNTER — Ambulatory Visit (HOSPITAL_BASED_OUTPATIENT_CLINIC_OR_DEPARTMENT_OTHER)
Admission: RE | Admit: 2022-08-28 | Discharge: 2022-08-28 | Disposition: A | Discharge status: Home / Self Care | Payer: Medicare Other | Source: Ambulatory Visit | Attending: Family Medicine | Admitting: Family Medicine

## 2022-08-28 ENCOUNTER — Encounter: Payer: Self-pay | Admitting: Family Medicine

## 2022-08-28 DIAGNOSIS — R7303 Prediabetes: Secondary | ICD-10-CM | POA: Insufficient documentation

## 2022-08-28 DIAGNOSIS — I10 Essential (primary) hypertension: Secondary | ICD-10-CM | POA: Insufficient documentation

## 2022-09-14 DIAGNOSIS — M79672 Pain in left foot: Secondary | ICD-10-CM | POA: Diagnosis not present

## 2022-09-18 DIAGNOSIS — Z23 Encounter for immunization: Secondary | ICD-10-CM | POA: Diagnosis not present

## 2022-09-25 DIAGNOSIS — M79672 Pain in left foot: Secondary | ICD-10-CM | POA: Diagnosis not present

## 2022-09-25 DIAGNOSIS — S9782XA Crushing injury of left foot, initial encounter: Secondary | ICD-10-CM | POA: Diagnosis not present

## 2022-10-14 ENCOUNTER — Encounter: Payer: Self-pay | Admitting: Family Medicine

## 2022-10-16 DIAGNOSIS — R6889 Other general symptoms and signs: Secondary | ICD-10-CM | POA: Diagnosis not present

## 2022-10-16 DIAGNOSIS — J029 Acute pharyngitis, unspecified: Secondary | ICD-10-CM | POA: Diagnosis not present

## 2022-10-16 DIAGNOSIS — J069 Acute upper respiratory infection, unspecified: Secondary | ICD-10-CM | POA: Diagnosis not present

## 2022-10-16 DIAGNOSIS — Z1152 Encounter for screening for COVID-19: Secondary | ICD-10-CM | POA: Diagnosis not present

## 2022-10-26 DIAGNOSIS — S9782XA Crushing injury of left foot, initial encounter: Secondary | ICD-10-CM | POA: Diagnosis not present

## 2022-10-29 ENCOUNTER — Encounter (INDEPENDENT_AMBULATORY_CARE_PROVIDER_SITE_OTHER): Payer: Medicare Other | Admitting: Family Medicine

## 2022-10-29 DIAGNOSIS — R059 Cough, unspecified: Secondary | ICD-10-CM

## 2022-10-29 DIAGNOSIS — R0602 Shortness of breath: Secondary | ICD-10-CM

## 2022-10-29 NOTE — Addendum Note (Signed)
Addended by: Lamar Blinks C on: 10/29/2022 05:30 PM   Modules accepted: Orders

## 2022-10-29 NOTE — Telephone Encounter (Signed)

## 2022-10-30 ENCOUNTER — Ambulatory Visit (HOSPITAL_BASED_OUTPATIENT_CLINIC_OR_DEPARTMENT_OTHER)
Admission: RE | Admit: 2022-10-30 | Discharge: 2022-10-30 | Disposition: A | Discharge status: Home / Self Care | Payer: Medicare Other | Source: Ambulatory Visit | Attending: Family Medicine | Admitting: Family Medicine

## 2022-10-30 ENCOUNTER — Other Ambulatory Visit (INDEPENDENT_AMBULATORY_CARE_PROVIDER_SITE_OTHER): Payer: Medicare Other

## 2022-10-30 ENCOUNTER — Encounter: Payer: Self-pay | Admitting: Family Medicine

## 2022-10-30 DIAGNOSIS — R0602 Shortness of breath: Secondary | ICD-10-CM | POA: Insufficient documentation

## 2022-10-30 DIAGNOSIS — R059 Cough, unspecified: Secondary | ICD-10-CM | POA: Diagnosis not present

## 2022-10-30 LAB — BASIC METABOLIC PANEL
BUN: 17 mg/dL (ref 6–23)
CO2: 26 mEq/L (ref 19–32)
Calcium: 9.8 mg/dL (ref 8.4–10.5)
Chloride: 99 mEq/L (ref 96–112)
Creatinine, Ser: 0.86 mg/dL (ref 0.40–1.20)
GFR: 66.42 mL/min (ref >60.00)
Glucose, Bld: 149 mg/dL — ABNORMAL HIGH (ref 70–99)
Potassium: 3.9 mEq/L (ref 3.5–5.1)
Sodium: 135 mEq/L (ref 135–145)

## 2022-10-30 LAB — D-DIMER, QUANTITATIVE: D-Dimer, Quant: 0.37 mcg/mL FEU (ref <0.50)

## 2022-10-30 MED ORDER — AMOXICILLIN-POT CLAVULANATE 875-125 MG PO TABS: 1.0000 | ORAL_TABLET | Freq: Two times a day (BID) | ORAL | 0 refills | Status: DC

## 2023-01-07 DIAGNOSIS — Z7184 Encounter for health counseling related to travel: Secondary | ICD-10-CM | POA: Diagnosis not present

## 2023-01-07 DIAGNOSIS — M79641 Pain in right hand: Secondary | ICD-10-CM | POA: Diagnosis not present

## 2023-01-07 DIAGNOSIS — R52 Pain, unspecified: Secondary | ICD-10-CM | POA: Diagnosis not present

## 2023-01-07 DIAGNOSIS — M13849 Other specified arthritis, unspecified hand: Secondary | ICD-10-CM | POA: Diagnosis not present

## 2023-01-15 DIAGNOSIS — L578 Other skin changes due to chronic exposure to nonionizing radiation: Secondary | ICD-10-CM | POA: Diagnosis not present

## 2023-01-15 DIAGNOSIS — D225 Melanocytic nevi of trunk: Secondary | ICD-10-CM | POA: Diagnosis not present

## 2023-01-15 DIAGNOSIS — D235 Other benign neoplasm of skin of trunk: Secondary | ICD-10-CM | POA: Diagnosis not present

## 2023-01-15 DIAGNOSIS — Z85828 Personal history of other malignant neoplasm of skin: Secondary | ICD-10-CM | POA: Diagnosis not present

## 2023-01-15 DIAGNOSIS — D2371 Other benign neoplasm of skin of right lower limb, including hip: Secondary | ICD-10-CM | POA: Diagnosis not present

## 2023-01-15 DIAGNOSIS — L719 Rosacea, unspecified: Secondary | ICD-10-CM | POA: Diagnosis not present

## 2023-01-15 DIAGNOSIS — L570 Actinic keratosis: Secondary | ICD-10-CM | POA: Diagnosis not present

## 2023-01-15 DIAGNOSIS — L821 Other seborrheic keratosis: Secondary | ICD-10-CM | POA: Diagnosis not present

## 2023-01-16 ENCOUNTER — Other Ambulatory Visit: Payer: Self-pay | Admitting: Family Medicine

## 2023-01-16 DIAGNOSIS — I10 Essential (primary) hypertension: Secondary | ICD-10-CM

## 2023-02-10 ENCOUNTER — Other Ambulatory Visit: Payer: Self-pay | Admitting: Family Medicine

## 2023-02-10 DIAGNOSIS — F419 Anxiety disorder, unspecified: Secondary | ICD-10-CM

## 2023-02-10 MED ORDER — ALPRAZOLAM 0.5 MG PO TABS: 0.5000 mg | ORAL_TABLET | Freq: Every evening | ORAL | 0 refills | Status: DC | PRN

## 2023-02-10 NOTE — Telephone Encounter (Signed)
Requesting: Xanax Contract: N/A UDS: N/A Last Visit: 08/27/2022 Next Visit: N/A Last Refill: 09/10/2021  Please Advise

## 2023-04-12 DIAGNOSIS — H25813 Combined forms of age-related cataract, bilateral: Secondary | ICD-10-CM | POA: Diagnosis not present

## 2023-04-12 DIAGNOSIS — H524 Presbyopia: Secondary | ICD-10-CM | POA: Diagnosis not present

## 2023-04-12 DIAGNOSIS — H40013 Open angle with borderline findings, low risk, bilateral: Secondary | ICD-10-CM | POA: Diagnosis not present

## 2023-04-16 ENCOUNTER — Encounter: Payer: Self-pay | Admitting: Family Medicine

## 2023-04-16 DIAGNOSIS — I10 Essential (primary) hypertension: Secondary | ICD-10-CM

## 2023-04-16 DIAGNOSIS — Z1211 Encounter for screening for malignant neoplasm of colon: Secondary | ICD-10-CM

## 2023-04-16 MED ORDER — AMLODIPINE BESYLATE 5 MG PO TABS
5.0000 mg | ORAL_TABLET | Freq: Every day | ORAL | 1 refills | Status: DC
Start: 2023-04-16 — End: 2023-10-19

## 2023-04-16 NOTE — Telephone Encounter (Signed)
I have sent a refill in.   Do you have any preference on GI?

## 2023-04-16 NOTE — Addendum Note (Signed)
Addended by: Abbe Amsterdam C on: 04/16/2023 03:03 PM   Modules accepted: Orders

## 2023-04-22 ENCOUNTER — Ambulatory Visit (HOSPITAL_BASED_OUTPATIENT_CLINIC_OR_DEPARTMENT_OTHER): Payer: Medicare Other | Admitting: Obstetrics & Gynecology

## 2023-04-22 DIAGNOSIS — D485 Neoplasm of uncertain behavior of skin: Secondary | ICD-10-CM | POA: Diagnosis not present

## 2023-04-22 DIAGNOSIS — L080 Pyoderma: Secondary | ICD-10-CM | POA: Diagnosis not present

## 2023-05-01 NOTE — Progress Notes (Addendum)
[facility] at [facility] [address] [place], [place] [address] [phone]  Date:  05/06/2023   Name:  Tammy Leonard   DOB:  08/15/1946   MRN:  [id number]  PCP:  Pearline Cables, MD    Chief Complaint: Yearly OV- Medicare (Concerns/ questions: none/AWV due/Colonoscopy: ref placed 04/16/23)   History of Present Illness:  Tammy Leonard is a 77 y.o. very pleasant female patient who presents with the following:  Patient seen today for periodic follow-up Most recent visit with myself was in November History of skin cancer, hypertension, spinal stenosis, diverticulitis, pre-diabetes  Her husband Tammy Leonard passed away in March 29, 2023She is followed by dermatology, Dr Emily Filbert  From out most recent visit in November: Jayra continues to deal that she is holding up pretty well- she is grieving but not depressed She is also recovering from a wrist injury - she was out from tennis for several months but is finally getting back.  She is also back playing some golf She is planning to to the [place] [employer] [place] trail in [place] this coming May!  Is is nearly 200 miles over 2 weeks She completed this trip and it was a great success for her!  Colon cancer screening- she has a virtual appt with GI in September  Recommend COVID, flu booster this fall Mammogram due in August- not scheduled  Can update DEXA scan  Amlodipine Alprazolam as needed Can update full lab panel today We did a CT coronary calcium for her last year, score of 0 Never smoker  Patient Active Problem List   Diagnosis Date Noted   Hamstring strain, left, initial encounter 05/14/2022   Spondylolisthesis of lumbar region 06/26/2021   History of chlamydia 04/09/2021   Family history of breast cancer 04/09/2021   Prediabetes 12/01/2020   Lumbar canal stenosis 08/04/2018   Osteopenia 02/04/2018   Squamous cell carcinoma in situ (SCCIS) of skin 01/05/2018    History of diverticulitis 12/16/2016   History of cryosurgery 08/28/2016   Elevated blood pressure 08/22/2013    Past Medical History:  Diagnosis Date   Abnormal Pap smear of cervix    Anxiety    Chlamydia    h/o   Diverticulitis    Hypertension    Osteopenia    hip and spine   Renal cyst    stable CT 5/11 [place] 10/12   SCC (squamous cell carcinoma), arm, left; hand, left 01/07/2018    Past Surgical History:  Procedure Laterality Date   ANTERIOR LAT LUMBAR FUSION Left 06/26/2021   Procedure: Left Lumbar 3-4, Lumbar 4-5 Anterior lateral lumbar interbody fusion with percutaneous pedicle screw fixation;  Surgeon: Maeola Harman, MD;  Location: [facility] OR;  Service: Neurosurgery;  Laterality: Left;   COLONOSCOPY     EYE SURGERY     GYNECOLOGIC CRYOSURGERY     LUMBAR PERCUTANEOUS PEDICLE SCREW 2 LEVEL N/A 06/26/2021   Procedure: LUMBAR PERCUTANEOUS PEDICLE SCREW 2 LEVEL;  Surgeon: Maeola Harman, MD;  Location: [facility] OR;  Service: Neurosurgery;  Laterality: N/A;   WISDOM TOOTH EXTRACTION      Social History   Tobacco Use   Smoking status: Never   Smokeless tobacco: Never  Vaping Use   Vaping status: Never Used  Substance Use Topics   Alcohol use: Yes    Alcohol/week: 7.0 standard drinks of alcohol    Types: 7 Standard drinks or equivalent per week    Comment: glass and a  half of wine daily   Drug use: No    Family History  Problem Relation Age of Onset   Heart disease Mother    Breast cancer Mother 56       and stomach cancer   Mitral valve prolapse Mother    Heart disease Father    Prostate cancer Father    Depression Daughter     Allergies  Allergen Reactions   Prochlorperazine Edisylate Anxiety and Other (See Comments)   Lisinopril     Patient had a respiratory illness with cough and change made in her blood pressure medication. She did not have an allergic reaction or angioedema    Medication list has been reviewed and updated.  Current Outpatient Medications on File  Prior to Visit  Medication Sig Dispense Refill   ALPRAZolam (XANAX) 0.5 MG tablet Take 1 tablet (0.5 mg total) by mouth at bedtime as needed. 30 tablet 0   amLODipine (NORVASC) 5 MG tablet Take 1 tablet (5 mg total) by mouth daily. 90 tablet 1   B Complex Vitamins (VITAMIN B COMPLEX PO) Take 1 tablet by mouth daily.     CALCIUM PO Take 1 tablet by mouth daily.     Multiple Vitamins-Minerals (MULTIVITAMIN PO) Take 1 tablet by mouth daily.     UNABLE TO FIND Take 1 tablet by mouth daily. Mag R & R (muscle recovery & sleep support     VITAMIN D, CHOLECALCIFEROL, PO Take by mouth.     No current facility-administered medications on file prior to visit.    Review of Systems:  As per HPI- otherwise negative.   Physical Examination: Vitals:   05/06/23 1302  BP: (!) 142/80  Pulse: 69  Resp: 18  Temp: 98.2 F (36.8 C)  SpO2: 97%   Vitals:   05/06/23 1302  Weight: 130 lb (59 kg)  Height: 5\' 1"  (1.549 m)   Body mass index is 24.56 kg/m. Ideal Body Weight: Weight in (lb) to have BMI = 25: 132  GEN: no acute distress.  Look well, very fit for age  HEENT: Atraumatic, Normocephalic.  Ears and Nose: No external deformity. CV: RRR, No M/G/R. No JVD. No thrill. No extra heart sounds. PULM: CTA B, no wheezes, crackles, rhonchi. No retractions. No resp. distress. No accessory muscle use. ABD: S, NT, ND, +BS. No rebound. No HSM. EXTR: No c/c/e PSYCH: Normally interactive. Conversant.    Assessment and Plan: Essential hypertension, benign - Plan: CBC, Comprehensive metabolic panel  Prediabetes - Plan: Hemoglobin A1c  Screening for thyroid disorder - Plan: TSH  Colon cancer screening  Estrogen deficiency - Plan: DG Bone Density  Screening for hyperlipidemia - Plan: Lipid panel  Encounter for screening mammogram for malignant neoplasm of breast - Plan: MM 3D SCREENING MAMMOGRAM BILATERAL BREAST  BP under adequate control Labs pending as above Ordered bone density and  mammogram   Signed Abbe Amsterdam, MD  Addendum 7/26, received labs as below.  Message to patient  Results for orders placed or performed in visit on 05/06/23  CBC  Result Value Ref Range   WBC 6.8 4.0 - 10.5 K/uL   RBC 4.53 3.87 - 5.11 Mil/uL   Platelets 357.0 150.0 - 400.0 K/uL   Hemoglobin 13.9 12.0 - 15.0 g/dL   HCT 96.2 95.2 - 84.1 %   MCV 95.1 78.0 - 100.0 fl   MCHC 32.2 30.0 - 36.0 g/dL   RDW 32.4 40.1 - 02.7 %  Comprehensive metabolic panel  Result Value Ref  Range   Sodium 137 135 - 145 mEq/L   Potassium 4.2 3.5 - 5.1 mEq/L   Chloride 100 96 - 112 mEq/L   CO2 26 19 - 32 mEq/L   Glucose, Bld 95 70 - 99 mg/dL   BUN 20 6 - 23 mg/dL   Creatinine, Ser 5.28 0.40 - 1.20 mg/dL   Total Bilirubin 0.5 0.2 - 1.2 mg/dL   Alkaline Phosphatase 58 39 - 117 U/L   AST 27 0 - 37 U/L   ALT 21 0 - 35 U/L   Total Protein 7.2 6.0 - 8.3 g/dL   Albumin 4.7 3.5 - 5.2 g/dL   GFR 41.32 >44.01 mL/min   Calcium 10.6 (H) 8.4 - 10.5 mg/dL  Hemoglobin U2V  Result Value Ref Range   Hgb A1c MFr Bld 6.1 4.6 - 6.5 %  Lipid panel  Result Value Ref Range   Cholesterol 233 (H) 0 - 200 mg/dL   Triglycerides 25.3 0.0 - 149.0 mg/dL   HDL 664.40 >34.74 mg/dL   VLDL 25.9 0.0 - 56.3 mg/dL   LDL Cholesterol 875 (H) 0 - 99 mg/dL   Total CHOL/HDL Ratio 2    NonHDL 113.35   TSH  Result Value Ref Range   TSH 0.60 0.35 - 5.50 uIU/mL

## 2023-05-01 NOTE — Patient Instructions (Incomplete)
It was great to see you again today, I will be in touch with your labs as it is possible Recommend COVID booster, flu shot this fall  Please set up your mammogram and dexa at your convenience at the [facility]

## 2023-05-06 ENCOUNTER — Encounter: Payer: Self-pay | Admitting: Family Medicine

## 2023-05-06 ENCOUNTER — Ambulatory Visit (INDEPENDENT_AMBULATORY_CARE_PROVIDER_SITE_OTHER): Payer: Medicare Other | Admitting: Family Medicine

## 2023-05-06 VITALS — BP 140/80 | HR 69 | Temp 98.2°F | Resp 18 | Ht 61.0 in | Wt 130.0 lb

## 2023-05-06 DIAGNOSIS — I10 Essential (primary) hypertension: Secondary | ICD-10-CM

## 2023-05-06 DIAGNOSIS — R7303 Prediabetes: Secondary | ICD-10-CM | POA: Diagnosis not present

## 2023-05-06 DIAGNOSIS — Z13220 Encounter for screening for lipoid disorders: Secondary | ICD-10-CM | POA: Diagnosis not present

## 2023-05-06 DIAGNOSIS — Z1231 Encounter for screening mammogram for malignant neoplasm of breast: Secondary | ICD-10-CM | POA: Diagnosis not present

## 2023-05-06 DIAGNOSIS — Z1211 Encounter for screening for malignant neoplasm of colon: Secondary | ICD-10-CM

## 2023-05-06 DIAGNOSIS — Z1329 Encounter for screening for other suspected endocrine disorder: Secondary | ICD-10-CM

## 2023-05-06 DIAGNOSIS — E2839 Other primary ovarian failure: Secondary | ICD-10-CM | POA: Diagnosis not present

## 2023-05-07 ENCOUNTER — Encounter: Payer: Self-pay | Admitting: Family Medicine

## 2023-05-31 ENCOUNTER — Ambulatory Visit
Admission: RE | Admit: 2023-05-31 | Discharge: 2023-05-31 | Disposition: A | Discharge status: Home / Self Care | Payer: Medicare Other | Source: Ambulatory Visit | Attending: Family Medicine | Admitting: Family Medicine

## 2023-05-31 DIAGNOSIS — Z1231 Encounter for screening mammogram for malignant neoplasm of breast: Secondary | ICD-10-CM

## 2023-08-24 NOTE — Progress Notes (Unsigned)
[facility] [address] [place], [place] [address] [phone]  Date:  08/26/2023   Name:  Tammy Leonard   DOB:  May 26, 1946   MRN:  [id number]  PCP:  Pearline Cables, MD    Chief Complaint: left ear discomfort (She wonders if using a qtip made her ear become impacted. )   History of Present Illness:  Tammy Leonard is a 77 y.o. very pleasant female patient who presents with the following:  Patient seen today with concern of ear and sinus congestion Most recent visit with myself was in July History of skin cancer, hypertension, spinal stenosis, diverticulitis, pre-diabetes  Her husband Gery Pray passed away in 01-01-22 Flu vaccine-  COVID booster Shingrix is complete Colon cancer screening-scheduled for January   She does use q-tips to keep her ears clean.  She was using this recently and then felt like her right ear was clogged She has tried some OTC earwax removal products It has hurt some when she will lie on it She has otherwise felt pretty normal- she has not noted any fever, etc She is not prone to ear infections The ear will sometimes seem to clear for a moment if she swallows. etc  Patient Active Problem List   Diagnosis Date Noted   Hamstring strain, left, initial encounter 05/14/2022   Spondylolisthesis of lumbar region 06/26/2021   History of chlamydia 04/09/2021   Family history of breast cancer 04/09/2021   Prediabetes 12/01/2020   Lumbar canal stenosis 08/04/2018   Osteopenia 02/04/2018   Squamous cell carcinoma in situ (SCCIS) of skin 01/05/2018   History of diverticulitis 12/16/2016   History of cryosurgery 08/28/2016   Elevated blood pressure 08/22/2013    Past Medical History:  Diagnosis Date   Abnormal Pap smear of cervix    Anxiety    Chlamydia    h/o   Diverticulitis    Hypertension    Osteopenia    hip and spine   Renal cyst    stable CT 5/11 [place]  10/12   SCC (squamous cell carcinoma), arm, left; hand, left 2018-01-01    Past Surgical History:  Procedure Laterality Date   ANTERIOR LAT LUMBAR FUSION Left 06/26/2021   Procedure: Left Lumbar 3-4, Lumbar 4-5 Anterior lateral lumbar interbody fusion with percutaneous pedicle screw fixation;  Surgeon: Maeola Harman, MD;  Location: [facility] OR;  Service: Neurosurgery;  Laterality: Left;   COLONOSCOPY     EYE SURGERY     GYNECOLOGIC CRYOSURGERY     LUMBAR PERCUTANEOUS PEDICLE SCREW 2 LEVEL N/A 06/26/2021   Procedure: LUMBAR PERCUTANEOUS PEDICLE SCREW 2 LEVEL;  Surgeon: Maeola Harman, MD;  Location: [facility] OR;  Service: Neurosurgery;  Laterality: N/A;   WISDOM TOOTH EXTRACTION      Social History   Tobacco Use   Smoking status: Never   Smokeless tobacco: Never  Vaping Use   Vaping status: Never Used  Substance Use Topics   Alcohol use: Yes    Alcohol/week: 7.0 standard drinks of alcohol    Types: 7 Standard drinks or equivalent per week    Comment: glass and a half of wine daily   Drug use: No    Family History  Problem Relation Age of Onset   Heart disease Mother    Breast cancer Mother 43       and stomach cancer   Mitral valve prolapse Mother    Heart disease Father  Prostate cancer Father    Depression Daughter     Allergies  Allergen Reactions   Prochlorperazine Edisylate Anxiety and Other (See Comments)   Lisinopril     Patient had a respiratory illness with cough and change made in her blood pressure medication. She did not have an allergic reaction or angioedema    Medication list has been reviewed and updated.  Current Outpatient Medications on File Prior to Visit  Medication Sig Dispense Refill   ALPRAZolam (XANAX) 0.5 MG tablet Take 1 tablet (0.5 mg total) by mouth at bedtime as needed. 30 tablet 0   amLODipine (NORVASC) 5 MG tablet Take 1 tablet (5 mg total) by mouth daily. 90 tablet 1   B Complex Vitamins (VITAMIN B COMPLEX PO) Take 1 tablet by mouth daily.      CALCIUM PO Take 1 tablet by mouth daily.     Multiple Vitamins-Minerals (MULTIVITAMIN PO) Take 1 tablet by mouth daily.     VITAMIN D, CHOLECALCIFEROL, PO Take by mouth.     No current facility-administered medications on file prior to visit.    Review of Systems:  As per HPI- otherwise negative.  BP Readings from Last 3 Encounters:  08/26/23 (!) 150/80  05/06/23 (!) 140/80  08/27/22 (!) 162/62     Physical Examination: Vitals:   08/26/23 0934 08/26/23 0950  BP: (!) 160/72 (!) 150/80  Pulse: 73   Resp: 18   Temp: 97.9 F (36.6 C)   SpO2: 97%    Vitals:   08/26/23 0934  Weight: 130 lb (59 kg)  Height: 5\' 1"  (1.549 m)   Body mass index is 24.56 kg/m. Ideal Body Weight: Weight in (lb) to have BMI = 25: 132  GEN: no acute distress. Normal weight looks well  Right TM is obscured with ear wax HEENT: Atraumatic, Normocephalic.  Ears and Nose: No external deformity. CV: RRR, No M/G/R. No JVD. No thrill. No extra heart sounds. PULM: CTA B, no wheezes, crackles, rhonchi. No retractions. No resp. distress. No accessory muscle use. EXTR: No c/c/e PSYCH: Normally interactive. Conversant.  VC obtained- Right ear irrigated with warm water and removed ear wax with good results/  pt tolerated well with no complications and noted improvement of her symptoms right away   Assessment and Plan: Impacted cerumen of right ear  Immunization due - Plan: Flu Vaccine Trivalent High Dose (Fluad)  Essential hypertension, benign  Cerumen impaction resolved Flu shot Asked her to record BP at home daily for 10 days or so and update me.  She notes her home BP is often normal but sometimes can be high first thing the in am   Signed Abbe Amsterdam, MD

## 2023-08-24 NOTE — Patient Instructions (Incomplete)
It was great to see you then today If not done already recommend flu vaccine- given today- and updated COVID booster Also consider getting a dose of RSV vaccine at your pharmacy Let me know if any other issues with your ear

## 2023-08-26 ENCOUNTER — Ambulatory Visit: Payer: Medicare Other | Admitting: Family Medicine

## 2023-08-26 ENCOUNTER — Encounter: Payer: Self-pay | Admitting: Family Medicine

## 2023-08-26 VITALS — BP 150/80 | HR 73 | Temp 97.9°F | Resp 18 | Ht 61.0 in | Wt 130.0 lb

## 2023-08-26 DIAGNOSIS — Z23 Encounter for immunization: Secondary | ICD-10-CM | POA: Diagnosis not present

## 2023-08-26 DIAGNOSIS — I10 Essential (primary) hypertension: Secondary | ICD-10-CM | POA: Diagnosis not present

## 2023-08-26 DIAGNOSIS — H6121 Impacted cerumen, right ear: Secondary | ICD-10-CM

## 2023-10-19 ENCOUNTER — Encounter: Payer: Self-pay | Admitting: Family Medicine

## 2023-10-19 DIAGNOSIS — I10 Essential (primary) hypertension: Secondary | ICD-10-CM

## 2023-10-19 MED ORDER — AMLODIPINE BESYLATE 5 MG PO TABS
5.0000 mg | ORAL_TABLET | Freq: Every day | ORAL | 1 refills | Status: DC
Start: 2023-10-19 — End: 2024-02-03

## 2023-11-09 ENCOUNTER — Ambulatory Visit
Admission: RE | Admit: 2023-11-09 | Discharge: 2023-11-09 | Disposition: A | Discharge status: Home / Self Care | Payer: Medicare Other | Source: Ambulatory Visit | Attending: Family Medicine | Admitting: Family Medicine

## 2023-11-09 ENCOUNTER — Encounter: Payer: Self-pay | Admitting: Family Medicine

## 2023-11-09 DIAGNOSIS — E2839 Other primary ovarian failure: Secondary | ICD-10-CM

## 2023-11-09 DIAGNOSIS — M8580 Other specified disorders of bone density and structure, unspecified site: Secondary | ICD-10-CM

## 2023-11-12 MED ORDER — ALENDRONATE SODIUM 70 MG PO TABS
70.0000 mg | ORAL_TABLET | ORAL | 3 refills | Status: DC
Start: 2023-11-12 — End: 2024-02-10

## 2023-12-08 ENCOUNTER — Encounter: Payer: Self-pay | Admitting: Family Medicine

## 2023-12-08 DIAGNOSIS — F419 Anxiety disorder, unspecified: Secondary | ICD-10-CM

## 2023-12-08 MED ORDER — ALPRAZOLAM 0.5 MG PO TABS
0.5000 mg | ORAL_TABLET | Freq: Every evening | ORAL | 0 refills | Status: DC | PRN
Start: 2023-12-08 — End: 2024-07-31

## 2023-12-17 ENCOUNTER — Encounter: Payer: Self-pay | Admitting: Family Medicine

## 2023-12-17 DIAGNOSIS — U071 COVID-19: Secondary | ICD-10-CM

## 2023-12-17 MED ORDER — NIRMATRELVIR/RITONAVIR (PAXLOVID)TABLET: 3.0000 | ORAL_TABLET | Freq: Two times a day (BID) | ORAL | 0 refills | Status: AC

## 2023-12-17 NOTE — Telephone Encounter (Signed)

## 2024-01-25 ENCOUNTER — Encounter: Payer: Self-pay | Admitting: Family Medicine

## 2024-02-03 ENCOUNTER — Other Ambulatory Visit: Payer: Self-pay | Admitting: Family Medicine

## 2024-02-03 DIAGNOSIS — I10 Essential (primary) hypertension: Secondary | ICD-10-CM

## 2024-02-08 NOTE — Progress Notes (Signed)
 [facility] [address], [address] [place], [place] [address] [phone]  Date:  02/10/2024   Name:  Tammy Leonard   DOB:  04/19/1946   MRN:  [id number]  PCP:  Kaylee Partridge, MD    Chief Complaint: Oral Pain (Onset "almost a month" /Pt states "it was painful in the beginning" "was starting to get better but has stayed the same for about a week and a half" )   History of Present Illness:  Tammy Leonard is a 78 y.o. very pleasant female patient who presents with the following:  Pt seen today with concern of a spot on the roof of her mouth Last seen by myself in the fall  History of skin cancer, hypertension, spinal stenosis, diverticulitis, pre-diabetes  Her husband Dean Every passed away in 12/28/2021 She first noted the finding in her mouth about a month ago No injury to her mouth It was painful- got better but is still sore  She stopped her fosamax  just in case it was related- it did not affect her mouth but she does note the constipation she was having went away so she would like to stay off treatment for now   Never been a tobacco user She has tended to drink most days- 1.5 glasses per day most days     Patient Active Problem List   Diagnosis Date Noted   Hamstring strain, left, initial encounter 05/14/2022   Spondylolisthesis of lumbar region 06/26/2021   History of chlamydia 04/09/2021   Family history of breast cancer 04/09/2021   Prediabetes 12/01/2020   Lumbar canal stenosis 08/04/2018   Osteopenia 02/04/2018   Squamous cell carcinoma in situ (SCCIS) of skin 01/05/2018   History of diverticulitis 12/16/2016   History of cryosurgery 08/28/2016   Elevated blood pressure 08/22/2013    Past Medical History:  Diagnosis Date   Abnormal Pap smear of cervix    Anxiety    Chlamydia    h/o   Diverticulitis    Hypertension    Osteopenia    hip and spine   Renal cyst    stable CT 5/11  US  10/12   SCC (squamous cell carcinoma), arm, left; hand, left 2017/12/28    Past Surgical History:  Procedure Laterality Date   ANTERIOR LAT LUMBAR FUSION Left 06/26/2021   Procedure: Left Lumbar 3-4, Lumbar 4-5 Anterior lateral lumbar interbody fusion with percutaneous pedicle screw fixation;  Surgeon: Manya Sells, MD;  Location: [facility] OR;  Service: Neurosurgery;  Laterality: Left;   COLONOSCOPY     EYE SURGERY     GYNECOLOGIC CRYOSURGERY     LUMBAR PERCUTANEOUS PEDICLE SCREW 2 LEVEL N/A 06/26/2021   Procedure: LUMBAR PERCUTANEOUS PEDICLE SCREW 2 LEVEL;  Surgeon: Manya Sells, MD;  Location: [facility] OR;  Service: Neurosurgery;  Laterality: N/A;   WISDOM TOOTH EXTRACTION      Social History   Tobacco Use   Smoking status: Never   Smokeless tobacco: Never  Vaping Use   Vaping status: Never Used  Substance Use Topics   Alcohol use: Yes    Alcohol/week: 7.0 standard drinks of alcohol    Types: 7 Standard drinks or equivalent per week    Comment: glass and a half of wine daily   Drug use: No    Family History  Problem Relation Age of Onset   Heart disease Mother    Breast cancer Mother 10  and stomach cancer   Mitral valve prolapse Mother    Heart disease Father    Prostate cancer Father    Depression Daughter     Allergies  Allergen Reactions   Prochlorperazine Edisylate Anxiety and Other (See Comments)   Lisinopril      Patient had a respiratory illness with cough and change made in her blood pressure medication. She did not have an allergic reaction or angioedema    Medication list has been reviewed and updated.  Current Outpatient Medications on File Prior to Visit  Medication Sig Dispense Refill   ALPRAZolam  (XANAX ) 0.5 MG tablet Take 1 tablet (0.5 mg total) by mouth at bedtime as needed. 30 tablet 0   amLODipine  (NORVASC ) 5 MG tablet Take 1 tablet (5 mg total) by mouth daily. 30 tablet 0   B Complex Vitamins (VITAMIN B COMPLEX  PO) Take 1 tablet by mouth daily.      CALCIUM PO Take 1 tablet by mouth daily.     Multiple Vitamins-Minerals (MULTIVITAMIN PO) Take 1 tablet by mouth daily.     VITAMIN D , CHOLECALCIFEROL, PO Take by mouth.     No current facility-administered medications on file prior to visit.    Review of Systems:  As per HPI- otherwise negative.   Physical Examination: Vitals:   02/10/24 1556  BP: 136/74  Pulse: 69  SpO2: 98%   Vitals:   02/10/24 1556  Weight: 131 lb (59.4 kg)  Height: 5\' 1"  (1.549 m)   Body mass index is 24.75 kg/m. Ideal Body Weight: Weight in (lb) to have BMI = 25: 132  GEN: no acute distress.  Normal weight, looks well HEENT: Atraumatic, Normocephalic.  Bilateral TM wnl, oropharynx normal.  PEERL,EOMI.   Ears and Nose: No external deformity. CV: RRR, No M/G/R. No JVD. No thrill. No extra heart sounds. PULM: CTA B, no wheezes, crackles, rhonchi. No retractions. No resp. distress. No accessory muscle use. ABD: S, NT, ND, +BS. No rebound. No HSM. EXTR: No c/c/e PSYCH: Normally interactive. Conversant.  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  It is firm, nontender consistent with palatine torus.  There is an adjacent area of mild erythema and irritation slightly anterior on the left side.  Patient believes this was from a recent hot food burn  Assessment and Plan: Torus palatinus  Patient seen today with concern of a growth on her hard palate, on exam consistent with torus palatinus.  Reassured her.  She is actually seeing her dentist next week, I asked her to make sure her dentist agrees with my assessment.  As discussed there is another small area of erythema separate from the torus.  Patient believes this is due to a recent burn, if this is not cleared up by her dental visit next week it can also be assessed  Signed Gates Kasal, MD

## 2024-02-10 ENCOUNTER — Encounter: Payer: Self-pay | Admitting: Family Medicine

## 2024-02-10 ENCOUNTER — Ambulatory Visit (INDEPENDENT_AMBULATORY_CARE_PROVIDER_SITE_OTHER): Admitting: Family Medicine

## 2024-02-10 VITALS — BP 136/74 | HR 69 | Ht 61.0 in | Wt 131.0 lb

## 2024-02-10 DIAGNOSIS — M270 Developmental disorders of jaws: Secondary | ICD-10-CM

## 2024-02-10 NOTE — Patient Instructions (Addendum)
 I believe you have a torus palatinus-  this is a common benign growth and does not need any other treatment Please make sure your dentist agrees - let me know any concerns   If you decide you would like to be treated for your bone density we can use Prolia for you- otherwise we can re-scan in about 18 months

## 2024-04-10 ENCOUNTER — Ambulatory Visit (HOSPITAL_BASED_OUTPATIENT_CLINIC_OR_DEPARTMENT_OTHER): Payer: Medicare Other | Admitting: Obstetrics & Gynecology

## 2024-04-10 ENCOUNTER — Encounter (HOSPITAL_BASED_OUTPATIENT_CLINIC_OR_DEPARTMENT_OTHER): Payer: Self-pay | Admitting: Obstetrics & Gynecology

## 2024-04-10 ENCOUNTER — Other Ambulatory Visit (HOSPITAL_COMMUNITY)
Admission: RE | Admit: 2024-04-10 | Discharge: 2024-04-10 | Disposition: A | Discharge status: Home / Self Care | Source: Ambulatory Visit | Attending: Obstetrics & Gynecology | Admitting: Obstetrics & Gynecology

## 2024-04-10 VITALS — BP 163/69 | HR 70 | Wt 127.6 lb

## 2024-04-10 DIAGNOSIS — Z124 Encounter for screening for malignant neoplasm of cervix: Secondary | ICD-10-CM

## 2024-04-10 DIAGNOSIS — M85851 Other specified disorders of bone density and structure, right thigh: Secondary | ICD-10-CM

## 2024-04-10 DIAGNOSIS — Z01419 Encounter for gynecological examination (general) (routine) without abnormal findings: Secondary | ICD-10-CM | POA: Diagnosis not present

## 2024-04-10 DIAGNOSIS — M85852 Other specified disorders of bone density and structure, left thigh: Secondary | ICD-10-CM

## 2024-04-10 DIAGNOSIS — K5792 Diverticulitis of intestine, part unspecified, without perforation or abscess without bleeding: Secondary | ICD-10-CM | POA: Diagnosis not present

## 2024-04-10 MED ORDER — AMOXICILLIN-POT CLAVULANATE 875-125 MG PO TABS: 1.0000 | ORAL_TABLET | Freq: Two times a day (BID) | ORAL | 0 refills | Status: DC

## 2024-04-10 NOTE — Progress Notes (Unsigned)
 Breast and Pelvic Exam Patient name: Shametra Cumberland MRN [id number]  Date of birth: [birth year] Chief Complaint:   Gynecologic Exam, osteopenia, and Diverticulitis  History of Present Illness:   Jory Welke is a 78 y.o. G2P2 Caucasian female being seen for breast and pelvic exam.  Currently being treated for diverticulitis.  Had some constipation when started fosamax .  Constipation causes diverticulitis.  She has stopped the fosamax  after 2 1/2 months.    Denies vaginal bleeding.    Husband passed since I saw her last.  He had HPV related tongue cancer.  She has a remote hx of dysplasia.  She would like to update her pap smear today.  Guidelines reviewed.    Patient's last menstrual period was 10/13/1999.  Last pap 2021. Results were: negative.   Last mammogram: 05/2023. Results were: normal. Last colonoscopy: 04/20/2016.  Small polyp was seen.  DEXA:  11/09/2023.  Osteopenia but increased FRAX hip score     05/06/2023    1:08 PM 08/27/2022   10:51 AM 12/17/2021    9:43 AM 04/09/2021    9:19 AM 09/29/2017    8:31 AM  Depression screen PHQ 2/9  Decreased Interest 0 0 0 0 0  Down, Depressed, Hopeless 0 0 0 0 0  PHQ - 2 Score 0 0 0 0 0    Review of Systems:   Pertinent items are noted in HPI Denies any urinary issues, bowel changes, pelvic pain Pertinent History Reviewed:  Reviewed past medical,surgical, social and family history.  Reviewed problem list, medications and allergies. Physical Assessment:   Vitals:   04/10/24 1545  BP: (!) 163/69  Pulse: 70  SpO2: 100%  Weight: 127 lb 9.6 oz (57.9 kg)  Body mass index is 24.11 kg/m.        Physical Examination:   General appearance - well appearing, and in no distress  Mental status - alert, oriented to person, place, and time  Psych:  She has a normal mood and affect  Skin - warm and dry, normal color, no suspicious lesions noted  Chest - effort normal, all lung fields clear to auscultation  bilaterally  Heart - normal rate and regular rhythm  Neck:  midline trachea, no thyromegaly or nodules  Breasts - breasts appear normal, no suspicious masses, no skin or nipple changes or  axillary nodes  Abdomen - soft, mild RLQ tenderness noted, nondistended, no masses or organomegaly  Pelvic - VULVA: normal appearing vulva with no masses, tenderness or lesions   VAGINA: atrophic changes noted CERVIX: normal appearing cervix without discharge or lesions, no CMT  Thin prep pap is done today  UTERUS: uterus is felt to be normal size, shape, consistency and nontender   ADNEXA: No adnexal masses or tenderness noted.  Rectal - deferred  Extremities:  No swelling or varicosities noted  Chaperone present for exam  No results found for this or any previous visit (from the past 24 hours).  Assessment & Plan:  1. Encntr for gyn exam (general) (routine) w/o abn findings (Primary) - Pap smear obtained per pt request today - Mammogram 05/2023 - Colonoscopy 2017 - Bone mineral density 10/2023 - lab work done with PCP, Dr. Watt - vaccines reviewed/updated  2. Cervical cancer screening - Cytology - PAP( [place])  3. Diverticulitis - RF for augmentin  sent to pharmacy.  Pt has upcoming travels and likes   4. Osteopenia of necks of both femurs - for now, she is off medication and does not  plan to restart. - taking calcium and Vit D - consider repeating BMD in 2-3 years unless would not take any new medications   Meds:  Meds ordered this encounter  Medications   amoxicillin -clavulanate (AUGMENTIN ) 875-125 MG tablet    Sig: Take 1 tablet by mouth 2 (two) times daily.    Dispense:  14 tablet    Refill:  0    Follow-up: Return for 1-2 years per pt desire or with any new concerns.  Ronal GORMAN Pinal, MD 04/13/2024 8:19 PM

## 2024-04-12 ENCOUNTER — Ambulatory Visit (HOSPITAL_BASED_OUTPATIENT_CLINIC_OR_DEPARTMENT_OTHER): Payer: Self-pay | Admitting: Obstetrics & Gynecology

## 2024-04-12 LAB — CYTOLOGY - PAP: Diagnosis: NEGATIVE

## 2024-06-07 ENCOUNTER — Other Ambulatory Visit: Payer: Self-pay | Admitting: Family Medicine

## 2024-06-07 DIAGNOSIS — I10 Essential (primary) hypertension: Secondary | ICD-10-CM

## 2024-06-27 ENCOUNTER — Telehealth: Payer: Self-pay | Admitting: Family Medicine

## 2024-06-27 NOTE — Telephone Encounter (Signed)
 Copied from CRM [id number]. Topic: Medicare AWV >> Jun 27, 2024 10:04 AM Nathanel DEL wrote: Reason for CRM: Called LVM 06/27/2024 to schedule AWV. Please schedule Virtual or Telehealth visits ONLY.   Nathanel Paschal; Care Guide Ambulatory Clinical Support [place] [facility] Direct Dial: [phone]

## 2024-07-07 ENCOUNTER — Other Ambulatory Visit: Payer: Self-pay | Admitting: Family Medicine

## 2024-07-07 DIAGNOSIS — Z0000 Encounter for general adult medical examination without abnormal findings: Secondary | ICD-10-CM

## 2024-07-18 ENCOUNTER — Ambulatory Visit
Admission: RE | Admit: 2024-07-18 | Discharge: 2024-07-18 | Disposition: A | Discharge status: Home / Self Care | Source: Ambulatory Visit | Attending: Family Medicine | Admitting: Family Medicine

## 2024-07-18 DIAGNOSIS — Z0000 Encounter for general adult medical examination without abnormal findings: Secondary | ICD-10-CM

## 2024-07-25 ENCOUNTER — Encounter: Payer: Self-pay | Admitting: Family Medicine

## 2024-07-25 ENCOUNTER — Ambulatory Visit: Payer: Self-pay

## 2024-07-25 ENCOUNTER — Telehealth: Admitting: Physician Assistant

## 2024-07-25 NOTE — Telephone Encounter (Signed)
 FYI Only or Action Required?: FYI only for provider.  Patient was last seen in primary care on 02/10/2024 by Copland, Harlene BROCKS, MD.  Called Nurse Triage reporting Abrasion.  Symptoms began several weeks ago.  Interventions attempted: OTC medications: Ampicillin (extra pt had at home) and Other: Washed wound with hydrogen peroxide.  Symptoms are: gradually worsening.  Triage Disposition: See Physician Within 24 Hours  Patient/caregiver understands and will follow disposition?: Yes Copied from CRM #[id number]. Topic: Clinical - Red Word Triage >> Jul 25, 2024  1:24 PM Franky GRADE wrote: Red Word that prompted transfer to Nurse Triage: Patient scraped her lower left leg on a big branch about 1 1/2 weeks ago and it started getting infected on Saturday night. Area is swollen and red.  Reason for Disposition  [1] Looks infected (e.g., spreading redness, pus) AND [2] no fever  Answer Assessment - Initial Assessment Questions Pt wanting to be seen today, no OV availability. Advised mobile bus.  1. LOCATION: Where is the wound located?      Left lower leg  2. WOUND APPEARANCE: What does the wound look like?      Red, swollen with pus.   3. SIZE: If redness is present, ask: What is the size of the red area? (Inches, centimeters, or compare to size of a coin)      1-2 inches  4. SPREAD: What's changed in the last day?  Do you see any red streaks coming from the wound?     Increased redness, swelling and pain  5. ONSET: When did it start to look infected?      Saturday   6. MECHANISM: How did the wound start, what was the cause?     Scraped leg on a branch  7. PAIN: Do you have any pain?  If Yes, ask: How bad is the pain?  (e.g., Scale 1-10; mild, moderate, or severe)     Mild  8. FEVER: Do you have a fever? If Yes, ask: What is your temperature, how was it measured, and when did it start?     No  9. OTHER SYMPTOMS: Do you have any other symptoms? (e.g., shaking  chills, weakness, rash elsewhere on body)     No  Protocols used: Wound Infection Suspected-A-AH

## 2024-07-26 ENCOUNTER — Ambulatory Visit: Admitting: Family

## 2024-07-26 VITALS — BP 141/63 | HR 69 | Temp 99.1°F | Resp 16 | Ht 61.0 in | Wt 129.0 lb

## 2024-07-26 DIAGNOSIS — L03116 Cellulitis of left lower limb: Secondary | ICD-10-CM | POA: Diagnosis not present

## 2024-07-26 DIAGNOSIS — Z23 Encounter for immunization: Secondary | ICD-10-CM

## 2024-07-26 MED ORDER — CEPHALEXIN 500 MG PO CAPS: 500.0000 mg | ORAL_CAPSULE | Freq: Three times a day (TID) | ORAL | 0 refills | Status: DC

## 2024-07-26 NOTE — Assessment & Plan Note (Signed)
 Skin wound with early developing cellulitis. Recommend rx with Keflex x 7 days. She is advised to call if increase/pain/redness drainage or if not significantly improved in 1 week. Pt verbalizes understanding.

## 2024-07-26 NOTE — Progress Notes (Signed)
 Subjective:     Patient ID: Tammy Leonard, female    DOB: 01-02-1946, 78 y.o.   MRN: [id number]  Chief Complaint  Patient presents with   Skin lesion    Patient complains of skin lesion on left leg     HPI  Discussed the use of AI scribe software for clinical note transcription with the patient, who gave verbal consent to proceed.  History of Present Illness   Tammy Leonard is a 78 year old female who presents with c/o a skin infection on her leg. Two weeks ago, she scraped her leg on a branch while working in the yard. The area was initially fine but became later became painful and hard to the touch Saturday night. She has been applying Neosporin and a Band-Aid most days. She took expired ampicillin on Sunday, Monday, and Tuesday without improvement or worsening. Initially, there was some exudate, but now it is scabbed over and not visible. She underwent spinal fusion on L4 and L5 three years ago, resulting in neuropathy mostly in her lower legs, which she wonders might affect healing. She has no allergies to antibiotics.   BP Readings from Last 3 Encounters:  07/26/24 (!) 141/63  04/10/24 (!) 163/69  02/10/24 136/74        Health Maintenance Due  Topic Date Due   Medicare Annual Wellness (AWV)  Never done   COVID-19 Vaccine (5 - 2025-26 season) 06/12/2024    Past Medical History:  Diagnosis Date   Abnormal Pap smear of cervix    Anxiety    Chlamydia    h/o   Diverticulitis    Hypertension    Osteopenia    hip and spine   Renal cyst    stable CT 5/11 US  10/12   SCC (squamous cell carcinoma), arm, left; hand, left 12/2017    Past Surgical History:  Procedure Laterality Date   ANTERIOR LAT LUMBAR FUSION Left 06/26/2021   Procedure: Left Lumbar 3-4, Lumbar 4-5 Anterior lateral lumbar interbody fusion with percutaneous pedicle screw fixation;  Surgeon: Unice Pac, MD;  Location: [facility] OR;  Service: Neurosurgery;  Laterality: Left;    COLONOSCOPY     EYE SURGERY     GYNECOLOGIC CRYOSURGERY     LUMBAR PERCUTANEOUS PEDICLE SCREW 2 LEVEL N/A 06/26/2021   Procedure: LUMBAR PERCUTANEOUS PEDICLE SCREW 2 LEVEL;  Surgeon: Unice Pac, MD;  Location: [facility] OR;  Service: Neurosurgery;  Laterality: N/A;   WISDOM TOOTH EXTRACTION      Family History  Problem Relation Age of Onset   Heart disease Mother    Breast cancer Mother 80       and stomach cancer   Mitral valve prolapse Mother    Heart disease Father    Prostate cancer Father    Depression Daughter     Social History   Socioeconomic History   Marital status: Widowed    Spouse name: Not on file   Number of children: Not on file   Years of education: Not on file   Highest education level: Bachelor's degree (e.g., BA, AB, BS)  Occupational History   Not on file  Tobacco Use   Smoking status: Never   Smokeless tobacco: Never  Vaping Use   Vaping status: Never Used  Substance and Sexual Activity   Alcohol use: Yes    Alcohol/week: 7.0 standard drinks of alcohol    Types: 7 Standard drinks or equivalent per week    Comment: glass and a half of  wine daily   Drug use: No   Sexual activity: Not Currently    Partners: Male    Birth control/protection: Other-see comments, Post-menopausal    Comment: vasectomy  Other Topics Concern   Not on file  Social History Narrative   Not on file   Social Drivers of Health   Financial Resource Strain: Low Risk  (07/25/2024)   Overall Financial Resource Strain (CARDIA)    Difficulty of Paying Living Expenses: Not hard at all  Food Insecurity: No Food Insecurity (07/25/2024)   Hunger Vital Sign    Worried About Running Out of Food in the Last Year: Never true    Ran Out of Food in the Last Year: Never true  Transportation Needs: No Transportation Needs (07/25/2024)   PRAPARE - Administrator, Civil Service (Medical): No    Lack of Transportation (Non-Medical): No  Physical Activity: Sufficiently Active  (07/25/2024)   Exercise Vital Sign    Days of Exercise per Week: 4 days    Minutes of Exercise per Session: 150+ min  Stress: No Stress Concern Present (07/25/2024)   [employer] of Occupational Health - Occupational Stress Questionnaire    Feeling of Stress: Only a little  Social Connections: Moderately Integrated (07/25/2024)   Social Connection and Isolation Panel    Frequency of Communication with Friends and Family: More than three times a week    Frequency of Social Gatherings with Friends and Family: More than three times a week    Attends Religious Services: More than 4 times per year    Active Member of [employer] or Organizations: Yes    Attends Banker Meetings: More than 4 times per year    Marital Status: Widowed  Intimate Partner Violence: Not on file    Outpatient Medications Prior to Visit  Medication Sig Dispense Refill   ALPRAZolam  (XANAX ) 0.5 MG tablet Take 1 tablet (0.5 mg total) by mouth at bedtime as needed. 30 tablet 0   amLODipine  (NORVASC ) 5 MG tablet Take 1 tablet (5 mg total) by mouth daily. 90 tablet 0   amoxicillin -clavulanate (AUGMENTIN ) 875-125 MG tablet Take 1 tablet by mouth 2 (two) times daily. 14 tablet 0   B Complex Vitamins (VITAMIN B COMPLEX  PO) Take 1 tablet by mouth daily.     CALCIUM PO Take 1 tablet by mouth daily.     Multiple Vitamins-Minerals (MULTIVITAMIN PO) Take 1 tablet by mouth daily.     VITAMIN D , CHOLECALCIFEROL, PO Take by mouth.     No facility-administered medications prior to visit.    Allergies  Allergen Reactions   Prochlorperazine Edisylate Anxiety and Other (See Comments)   Lisinopril      Patient had a respiratory illness with cough and change made in her blood pressure medication. She did not have an allergic reaction or angioedema    ROS See HPI    Objective:    Physical Exam Constitutional:      Appearance: Normal appearance.  Cardiovascular:     Rate and Rhythm: Normal rate.  Pulmonary:      Effort: Pulmonary effort is normal.  Skin:    Comments: Wound noted left lower/lateral leg with some surrounding erythema  Neurological:     Mental Status: She is alert.       BP (!) 141/63 (BP Location: Right Arm, Patient Position: Sitting, Cuff Size: Small)   Pulse 69   Temp 99.1 F (37.3 C) (Oral)   Resp 16   Ht 5' 1 (  1.549 m)   Wt 129 lb (58.5 kg)   LMP 10/13/1999   SpO2 98%   BMI 24.37 kg/m  Wt Readings from Last 3 Encounters:  07/26/24 129 lb (58.5 kg)  04/10/24 127 lb 9.6 oz (57.9 kg)  02/10/24 131 lb (59.4 kg)       Assessment & Plan:   Problem List Items Addressed This Visit       Unprioritized   Cellulitis of left lower extremity - Primary   Skin wound with early developing cellulitis. Recommend rx with Keflex x 7 days. She is advised to call if increase/pain/redness drainage or if not significantly improved in 1 week. Pt verbalizes understanding.       Relevant Medications   cephALEXin (KEFLEX) 500 MG capsule   Other Visit Diagnoses       Needs flu shot       Relevant Orders   Flu vaccine HIGH DOSE PF(Fluzone Trivalent) (Completed)       I am having Devere LOIS Ingles start on cephALEXin. I am also having her maintain her Multiple Vitamins-Minerals (MULTIVITAMIN PO), CALCIUM PO, B Complex Vitamins (VITAMIN B COMPLEX  PO), (VITAMIN D , CHOLECALCIFEROL, PO), ALPRAZolam , amoxicillin -clavulanate, and amLODipine .  Meds ordered this encounter  Medications   cephALEXin (KEFLEX) 500 MG capsule    Sig: Take 1 capsule (500 mg total) by mouth 3 (three) times daily.    Dispense:  21 capsule    Refill:  0    Supervising Provider:   DOMENICA BLACKBIRD A [[id number]]

## 2024-07-26 NOTE — Patient Instructions (Signed)
 VISIT SUMMARY:  Today, you were seen for a skin infection on your leg that developed after a scrape. We discussed your symptoms and treatment options.  YOUR PLAN:  CELLULITIS OF LEFT LOWER LIMB: You have a skin infection on your left leg, likely from a scrape. The infection has not improved with the initial self-treatment. -You have been prescribed Keflex to treat the infection. Please take it as directed. -Monitor the infection closely. If there is no improvement, contact us  for a possible adjustment to your antibiotics.  GENERAL HEALTH MAINTENANCE: We discussed the importance of scheduling a regular Medicare Annual Wellness visit.

## 2024-07-31 ENCOUNTER — Other Ambulatory Visit: Payer: Self-pay | Admitting: Family Medicine

## 2024-07-31 DIAGNOSIS — F419 Anxiety disorder, unspecified: Secondary | ICD-10-CM

## 2024-09-08 ENCOUNTER — Other Ambulatory Visit: Payer: Self-pay | Admitting: Family Medicine

## 2024-09-08 DIAGNOSIS — I10 Essential (primary) hypertension: Secondary | ICD-10-CM

## 2024-09-24 NOTE — Patient Instructions (Incomplete)
 It was great to see you today, I will be in touch with your lab work Recommend 1 dose of RSV and a tetanus booster next year at your pharmacy

## 2024-09-24 NOTE — Progress Notes (Unsigned)
 [facility] [address] [place], [place] [address] [phone]  Date:  09/27/2024   Name:  Tammy Leonard   DOB:  06-08-1946   MRN:  [id number]  PCP:  Watt Harlene BROCKS, MD    Chief Complaint: No chief complaint on file.   History of Present Illness:  Tammy Leonard is a 78 y.o. very pleasant female patient who presents with the following:  Patient seen today for medication follow-up.  I saw her most recently in May History of skin cancer, hypertension, spinal stenosis, diverticulitis, pre-diabetes  Her husband Wadie passed away in April 05, 2023from head and neck cancer  Remind that tetanus is due next year Flu shot is up-to-date She has completed Shingrix and pneumonia vaccination Recommend 1 dose of RSV Can update blood work today Mammogram and DEXA scan up-to-date Colonoscopy 2017 per Dr. Luis  Amlodipine  5 Alprazolam  as needed  Discussed the use of AI scribe software for clinical note transcription with the patient, who gave verbal consent to proceed.  History of Present Illness    Patient Active Problem List   Diagnosis Date Noted   Cellulitis of left lower extremity 07/26/2024   Hamstring strain, left, initial encounter 05/14/2022   Spondylolisthesis of lumbar region 06/26/2021   History of chlamydia 04/09/2021   Family history of breast cancer 04/09/2021   Prediabetes 12/01/2020   Lumbar canal stenosis 08/04/2018   Osteopenia 02/04/2018   Squamous cell carcinoma in situ (SCCIS) of skin 01/05/2018   History of diverticulitis 12/16/2016   History of cryosurgery 08/28/2016   Elevated blood pressure 08/22/2013    Past Medical History:  Diagnosis Date   Abnormal Pap smear of cervix    Anxiety    Chlamydia    h/o   Diverticulitis    Hypertension    Osteopenia    hip and spine   Renal cyst    stable CT 5/11 US  10/12   SCC (squamous cell carcinoma), arm, left;  hand, left January 14, 2018    Past Surgical History:  Procedure Laterality Date   ANTERIOR LAT LUMBAR FUSION Left 06/26/2021   Procedure: Left Lumbar 3-4, Lumbar 4-5 Anterior lateral lumbar interbody fusion with percutaneous pedicle screw fixation;  Surgeon: Unice Pac, MD;  Location: [facility] OR;  Service: Neurosurgery;  Laterality: Left;   COLONOSCOPY     EYE SURGERY     GYNECOLOGIC CRYOSURGERY     LUMBAR PERCUTANEOUS PEDICLE SCREW 2 LEVEL N/A 06/26/2021   Procedure: LUMBAR PERCUTANEOUS PEDICLE SCREW 2 LEVEL;  Surgeon: Unice Pac, MD;  Location: [facility] OR;  Service: Neurosurgery;  Laterality: N/A;   WISDOM TOOTH EXTRACTION      Social History[1]  Family History  Problem Relation Age of Onset   Heart disease Mother    Breast cancer Mother 57       and stomach cancer   Mitral valve prolapse Mother    Heart disease Father    Prostate cancer Father    Depression Daughter     Allergies[2]  Medication list has been reviewed and updated.  Medications Ordered Prior to Encounter[3]  Review of Systems:  As per HPI- otherwise negative.   Physical Examination: There were no vitals filed for this visit. There were no vitals filed for this visit. There is no height or weight on file to calculate BMI. Ideal Body Weight:    GEN: no acute distress. HEENT: Atraumatic, Normocephalic.  Ears and Nose: No external deformity.  CV: RRR, No M/G/R. No JVD. No thrill. No extra heart sounds. PULM: CTA B, no wheezes, crackles, rhonchi. No retractions. No resp. distress. No accessory muscle use. ABD: S, NT, ND, +BS. No rebound. No HSM. EXTR: No c/c/e PSYCH: Normally interactive. Conversant.    Assessment and Plan: No diagnosis found.  Assessment & Plan   Signed Harlene Schroeder, MD    [1]  Social History Tobacco Use   Smoking status: Never   Smokeless tobacco: Never  Vaping Use   Vaping status: Never Used  Substance Use Topics   Alcohol use: Yes    Alcohol/week: 7.0 standard drinks of  alcohol    Types: 7 Standard drinks or equivalent per week    Comment: glass and a half of wine daily   Drug use: No  [2]  Allergies Allergen Reactions   Prochlorperazine Edisylate Anxiety and Other (See Comments)   Lisinopril      Patient had a respiratory illness with cough and change made in her blood pressure medication. She did not have an allergic reaction or angioedema  [3]  Current Outpatient Medications on File Prior to Visit  Medication Sig Dispense Refill   ALPRAZolam  (XANAX ) 0.5 MG tablet Take 1 tablet (0.5 mg total) by mouth at bedtime as needed. 30 tablet 0   amLODipine  (NORVASC ) 5 MG tablet Take 1 tablet (5 mg total) by mouth daily. 90 tablet 0   amoxicillin -clavulanate (AUGMENTIN ) 875-125 MG tablet Take 1 tablet by mouth 2 (two) times daily. 14 tablet 0   B Complex Vitamins (VITAMIN B COMPLEX  PO) Take 1 tablet by mouth daily.     CALCIUM PO Take 1 tablet by mouth daily.     cephALEXin  (KEFLEX ) 500 MG capsule Take 1 capsule (500 mg total) by mouth 3 (three) times daily. 21 capsule 0   Multiple Vitamins-Minerals (MULTIVITAMIN PO) Take 1 tablet by mouth daily.     VITAMIN D , CHOLECALCIFEROL, PO Take by mouth.     No current facility-administered medications on file prior to visit.

## 2024-09-27 ENCOUNTER — Encounter: Payer: Self-pay | Admitting: Family Medicine

## 2024-09-27 ENCOUNTER — Ambulatory Visit: Admitting: Family Medicine

## 2024-09-27 VITALS — BP 140/80 | HR 70 | Ht 61.0 in | Wt 130.6 lb

## 2024-09-27 DIAGNOSIS — Z1329 Encounter for screening for other suspected endocrine disorder: Secondary | ICD-10-CM

## 2024-09-27 DIAGNOSIS — I10 Essential (primary) hypertension: Secondary | ICD-10-CM | POA: Diagnosis not present

## 2024-09-27 DIAGNOSIS — Z13220 Encounter for screening for lipoid disorders: Secondary | ICD-10-CM | POA: Diagnosis not present

## 2024-09-27 DIAGNOSIS — R7303 Prediabetes: Secondary | ICD-10-CM | POA: Diagnosis not present

## 2024-09-27 DIAGNOSIS — F419 Anxiety disorder, unspecified: Secondary | ICD-10-CM | POA: Diagnosis not present

## 2024-09-27 LAB — COMPREHENSIVE METABOLIC PANEL WITH GFR
ALT: 21 U/L (ref 3–35)
AST: 28 U/L (ref 5–37)
Albumin: 4.6 g/dL (ref 3.5–5.2)
Alkaline Phosphatase: 52 U/L (ref 39–117)
BUN: 18 mg/dL (ref 6–23)
CO2: 30 meq/L (ref 19–32)
Calcium: 10.2 mg/dL (ref 8.4–10.5)
Chloride: 101 meq/L (ref 96–112)
Creatinine, Ser: 0.84 mg/dL (ref 0.40–1.20)
GFR: 67.41 mL/min (ref >60.00)
Glucose, Bld: 96 mg/dL (ref 70–99)
Potassium: 4.5 meq/L (ref 3.5–5.1)
Sodium: 139 meq/L (ref 135–145)
Total Bilirubin: 0.6 mg/dL (ref 0.2–1.2)
Total Protein: 7 g/dL (ref 6.0–8.3)

## 2024-09-27 LAB — CBC
HCT: 42 % (ref 36.0–46.0)
Hemoglobin: 14.1 g/dL (ref 12.0–15.0)
MCHC: 33.6 g/dL (ref 30.0–36.0)
MCV: 92.6 fl (ref 78.0–100.0)
Platelets: 352 K/uL (ref 150.0–400.0)
RBC: 4.54 Mil/uL (ref 3.87–5.11)
RDW: 14 % (ref 11.5–15.5)
WBC: 4.8 K/uL (ref 4.0–10.5)

## 2024-09-27 LAB — LIPID PANEL
Cholesterol: 211 mg/dL — ABNORMAL HIGH (ref 28–200)
HDL: 119.2 mg/dL (ref >39.00)
LDL Cholesterol: 79 mg/dL (ref 10–99)
NonHDL: 91.38
Total CHOL/HDL Ratio: 2
Triglycerides: 61 mg/dL (ref 10.0–149.0)
VLDL: 12.2 mg/dL (ref 0.0–40.0)

## 2024-09-27 LAB — TSH: TSH: 0.75 u[IU]/mL (ref 0.35–5.50)

## 2024-09-27 LAB — HEMOGLOBIN A1C: Hgb A1c MFr Bld: 6 % (ref 4.6–6.5)

## 2024-09-27 MED ORDER — AMLODIPINE BESYLATE 5 MG PO TABS: 5.0000 mg | ORAL_TABLET | Freq: Every day | ORAL | 3 refills | Status: AC
